# Patient Record
Sex: Male | Born: 1980 | Race: White | Hispanic: No | Marital: Single | Smoking: Current every day smoker
Health system: Southern US, Community
[De-identification: ages and names within clinical notes are randomized; demographics above are authoritative.]

## PROBLEM LIST (undated history)

## (undated) DIAGNOSIS — K920 Hematemesis: Secondary | ICD-10-CM

## (undated) DIAGNOSIS — K7010 Alcoholic hepatitis without ascites: Secondary | ICD-10-CM

## (undated) DIAGNOSIS — E1165 Type 2 diabetes mellitus with hyperglycemia: Secondary | ICD-10-CM

## (undated) DIAGNOSIS — D696 Thrombocytopenia, unspecified: Secondary | ICD-10-CM

## (undated) DIAGNOSIS — F418 Other specified anxiety disorders: Secondary | ICD-10-CM

## (undated) DIAGNOSIS — I10 Essential (primary) hypertension: Secondary | ICD-10-CM

## (undated) DIAGNOSIS — D689 Coagulation defect, unspecified: Secondary | ICD-10-CM

## (undated) DIAGNOSIS — F1010 Alcohol abuse, uncomplicated: Secondary | ICD-10-CM

## (undated) DIAGNOSIS — K219 Gastro-esophageal reflux disease without esophagitis: Secondary | ICD-10-CM

## (undated) HISTORY — PX: WISDOM TOOTH EXTRACTION: SHX21

---

## 2015-09-11 DIAGNOSIS — D689 Coagulation defect, unspecified: Secondary | ICD-10-CM

## 2015-09-11 DIAGNOSIS — F418 Other specified anxiety disorders: Secondary | ICD-10-CM

## 2015-09-11 DIAGNOSIS — D696 Thrombocytopenia, unspecified: Secondary | ICD-10-CM

## 2015-09-11 DIAGNOSIS — K7010 Alcoholic hepatitis without ascites: Secondary | ICD-10-CM

## 2015-09-11 DIAGNOSIS — K920 Hematemesis: Secondary | ICD-10-CM

## 2015-09-11 HISTORY — DX: Alcoholic hepatitis without ascites: K70.10

## 2015-09-11 HISTORY — DX: Coagulation defect, unspecified: D68.9

## 2015-09-11 HISTORY — DX: Other specified anxiety disorders: F41.8

## 2015-09-11 HISTORY — DX: Hematemesis: K92.0

## 2015-09-11 HISTORY — DX: Thrombocytopenia, unspecified: D69.6

## 2015-09-12 ENCOUNTER — Emergency Department (HOSPITAL_COMMUNITY)
Admission: EM | Admit: 2015-09-12 | Discharge: 2015-09-12 | Disposition: A | Discharge status: Home / Self Care | Payer: Self-pay | Attending: Emergency Medicine | Admitting: Emergency Medicine

## 2015-09-12 ENCOUNTER — Encounter (HOSPITAL_COMMUNITY): Payer: Self-pay | Admitting: Emergency Medicine

## 2015-09-12 ENCOUNTER — Emergency Department (HOSPITAL_COMMUNITY): Payer: Self-pay

## 2015-09-12 DIAGNOSIS — F17210 Nicotine dependence, cigarettes, uncomplicated: Secondary | ICD-10-CM | POA: Insufficient documentation

## 2015-09-12 DIAGNOSIS — R079 Chest pain, unspecified: Secondary | ICD-10-CM | POA: Insufficient documentation

## 2015-09-12 DIAGNOSIS — I10 Essential (primary) hypertension: Secondary | ICD-10-CM | POA: Insufficient documentation

## 2015-09-12 DIAGNOSIS — R0789 Other chest pain: Secondary | ICD-10-CM | POA: Insufficient documentation

## 2015-09-12 DIAGNOSIS — R0602 Shortness of breath: Secondary | ICD-10-CM | POA: Insufficient documentation

## 2015-09-12 DIAGNOSIS — R531 Weakness: Secondary | ICD-10-CM | POA: Insufficient documentation

## 2015-09-12 DIAGNOSIS — R42 Dizziness and giddiness: Secondary | ICD-10-CM | POA: Insufficient documentation

## 2015-09-12 DIAGNOSIS — F419 Anxiety disorder, unspecified: Secondary | ICD-10-CM | POA: Insufficient documentation

## 2015-09-12 DIAGNOSIS — R5383 Other fatigue: Secondary | ICD-10-CM | POA: Insufficient documentation

## 2015-09-12 HISTORY — DX: Essential (primary) hypertension: I10

## 2015-09-12 LAB — CBC
HCT: 45.1 % (ref 39.0–52.0)
Hemoglobin: 15.9 g/dL (ref 13.0–17.0)
MCH: 33.6 pg (ref 26.0–34.0)
MCHC: 35.3 g/dL (ref 30.0–36.0)
MCV: 95.3 fL (ref 78.0–100.0)
Platelets: 135 10*3/uL — ABNORMAL LOW (ref 150–400)
RBC: 4.73 MIL/uL (ref 4.22–5.81)
RDW: 13.8 % (ref 11.5–15.5)
WBC: 16.1 10*3/uL — ABNORMAL HIGH (ref 4.0–10.5)

## 2015-09-12 LAB — BASIC METABOLIC PANEL
Anion gap: 17 — ABNORMAL HIGH (ref 5–15)
CALCIUM: 8.7 mg/dL — AB (ref 8.9–10.3)
CHLORIDE: 93 mmol/L — AB (ref 101–111)
CO2: 25 mmol/L (ref 22–32)
CREATININE: 0.67 mg/dL (ref 0.61–1.24)
GFR calc non Af Amer: >60 mL/min (ref >60)
Glucose, Bld: 303 mg/dL — ABNORMAL HIGH (ref 65–99)
Potassium: 3.4 mmol/L — ABNORMAL LOW (ref 3.5–5.1)
Sodium: 135 mmol/L (ref 135–145)

## 2015-09-12 LAB — I-STAT TROPONIN, ED: Troponin i, poc: 0.01 ng/mL (ref 0.00–0.08)

## 2015-09-12 NOTE — ED Notes (Signed)
No answer from waiting room.

## 2015-09-12 NOTE — ED Notes (Signed)
Pt c/o SOB, chest tightness, weakness, fatigue, anxiety, dizziness x 8 days. Pt reports strep throat at beginning of January, took penicillin course, never really felt better.

## 2015-09-20 ENCOUNTER — Inpatient Hospital Stay (HOSPITAL_COMMUNITY)
Admission: EM | Admit: 2015-09-20 | Discharge: 2015-09-28 | DRG: 433 | Disposition: A | Discharge status: Home / Self Care | Payer: Medicaid - Out of State | Attending: Internal Medicine | Admitting: Internal Medicine

## 2015-09-20 ENCOUNTER — Encounter (HOSPITAL_COMMUNITY): Payer: Self-pay | Admitting: Emergency Medicine

## 2015-09-20 ENCOUNTER — Emergency Department (HOSPITAL_COMMUNITY): Payer: Medicaid - Out of State

## 2015-09-20 DIAGNOSIS — R739 Hyperglycemia, unspecified: Secondary | ICD-10-CM | POA: Diagnosis not present

## 2015-09-20 DIAGNOSIS — K700 Alcoholic fatty liver: Secondary | ICD-10-CM | POA: Diagnosis present

## 2015-09-20 DIAGNOSIS — K759 Inflammatory liver disease, unspecified: Secondary | ICD-10-CM | POA: Diagnosis present

## 2015-09-20 DIAGNOSIS — F10231 Alcohol dependence with withdrawal delirium: Secondary | ICD-10-CM

## 2015-09-20 DIAGNOSIS — K219 Gastro-esophageal reflux disease without esophagitis: Secondary | ICD-10-CM | POA: Diagnosis present

## 2015-09-20 DIAGNOSIS — D72829 Elevated white blood cell count, unspecified: Secondary | ICD-10-CM | POA: Diagnosis not present

## 2015-09-20 DIAGNOSIS — E871 Hypo-osmolality and hyponatremia: Secondary | ICD-10-CM | POA: Diagnosis present

## 2015-09-20 DIAGNOSIS — R1084 Generalized abdominal pain: Secondary | ICD-10-CM | POA: Diagnosis not present

## 2015-09-20 DIAGNOSIS — F10230 Alcohol dependence with withdrawal, uncomplicated: Secondary | ICD-10-CM

## 2015-09-20 DIAGNOSIS — K920 Hematemesis: Secondary | ICD-10-CM | POA: Diagnosis present

## 2015-09-20 DIAGNOSIS — Z811 Family history of alcohol abuse and dependence: Secondary | ICD-10-CM

## 2015-09-20 DIAGNOSIS — R109 Unspecified abdominal pain: Secondary | ICD-10-CM | POA: Diagnosis not present

## 2015-09-20 DIAGNOSIS — R0781 Pleurodynia: Secondary | ICD-10-CM | POA: Insufficient documentation

## 2015-09-20 DIAGNOSIS — F10239 Alcohol dependence with withdrawal, unspecified: Secondary | ICD-10-CM | POA: Diagnosis present

## 2015-09-20 DIAGNOSIS — K7290 Hepatic failure, unspecified without coma: Secondary | ICD-10-CM | POA: Diagnosis present

## 2015-09-20 DIAGNOSIS — F10930 Alcohol use, unspecified with withdrawal, uncomplicated: Secondary | ICD-10-CM

## 2015-09-20 DIAGNOSIS — K0510 Chronic gingivitis, plaque induced: Secondary | ICD-10-CM | POA: Diagnosis present

## 2015-09-20 DIAGNOSIS — E876 Hypokalemia: Secondary | ICD-10-CM | POA: Diagnosis present

## 2015-09-20 DIAGNOSIS — R002 Palpitations: Secondary | ICD-10-CM | POA: Diagnosis present

## 2015-09-20 DIAGNOSIS — Z8349 Family history of other endocrine, nutritional and metabolic diseases: Secondary | ICD-10-CM | POA: Diagnosis not present

## 2015-09-20 DIAGNOSIS — R531 Weakness: Secondary | ICD-10-CM | POA: Diagnosis present

## 2015-09-20 DIAGNOSIS — IMO0002 Reserved for concepts with insufficient information to code with codable children: Secondary | ICD-10-CM | POA: Diagnosis present

## 2015-09-20 DIAGNOSIS — R140 Abdominal distension (gaseous): Secondary | ICD-10-CM | POA: Diagnosis present

## 2015-09-20 DIAGNOSIS — R188 Other ascites: Secondary | ICD-10-CM

## 2015-09-20 DIAGNOSIS — R1310 Dysphagia, unspecified: Secondary | ICD-10-CM | POA: Diagnosis present

## 2015-09-20 DIAGNOSIS — R059 Cough, unspecified: Secondary | ICD-10-CM

## 2015-09-20 DIAGNOSIS — K7011 Alcoholic hepatitis with ascites: Principal | ICD-10-CM | POA: Diagnosis present

## 2015-09-20 DIAGNOSIS — I10 Essential (primary) hypertension: Secondary | ICD-10-CM | POA: Diagnosis present

## 2015-09-20 DIAGNOSIS — E1165 Type 2 diabetes mellitus with hyperglycemia: Secondary | ICD-10-CM | POA: Diagnosis present

## 2015-09-20 DIAGNOSIS — K3189 Other diseases of stomach and duodenum: Secondary | ICD-10-CM | POA: Diagnosis present

## 2015-09-20 DIAGNOSIS — Y908 Blood alcohol level of 240 mg/100 ml or more: Secondary | ICD-10-CM | POA: Diagnosis present

## 2015-09-20 DIAGNOSIS — K766 Portal hypertension: Secondary | ICD-10-CM | POA: Diagnosis present

## 2015-09-20 DIAGNOSIS — K7010 Alcoholic hepatitis without ascites: Secondary | ICD-10-CM | POA: Diagnosis present

## 2015-09-20 DIAGNOSIS — D6959 Other secondary thrombocytopenia: Secondary | ICD-10-CM | POA: Diagnosis present

## 2015-09-20 DIAGNOSIS — R05 Cough: Secondary | ICD-10-CM

## 2015-09-20 DIAGNOSIS — F17210 Nicotine dependence, cigarettes, uncomplicated: Secondary | ICD-10-CM | POA: Diagnosis present

## 2015-09-20 DIAGNOSIS — R17 Unspecified jaundice: Secondary | ICD-10-CM

## 2015-09-20 DIAGNOSIS — F410 Panic disorder [episodic paroxysmal anxiety] without agoraphobia: Secondary | ICD-10-CM | POA: Diagnosis present

## 2015-09-20 DIAGNOSIS — F10939 Alcohol use, unspecified with withdrawal, unspecified: Secondary | ICD-10-CM | POA: Diagnosis present

## 2015-09-20 DIAGNOSIS — Z8249 Family history of ischemic heart disease and other diseases of the circulatory system: Secondary | ICD-10-CM

## 2015-09-20 DIAGNOSIS — R791 Abnormal coagulation profile: Secondary | ICD-10-CM

## 2015-09-20 DIAGNOSIS — F1010 Alcohol abuse, uncomplicated: Secondary | ICD-10-CM

## 2015-09-20 HISTORY — DX: Thrombocytopenia, unspecified: D69.6

## 2015-09-20 HISTORY — DX: Type 2 diabetes mellitus with hyperglycemia: E11.65

## 2015-09-20 HISTORY — DX: Gastro-esophageal reflux disease without esophagitis: K21.9

## 2015-09-20 HISTORY — DX: Hematemesis: K92.0

## 2015-09-20 HISTORY — DX: Other specified anxiety disorders: F41.8

## 2015-09-20 HISTORY — DX: Alcohol abuse, uncomplicated: F10.10

## 2015-09-20 HISTORY — DX: Alcoholic hepatitis without ascites: K70.10

## 2015-09-20 HISTORY — DX: Coagulation defect, unspecified: D68.9

## 2015-09-20 LAB — URINALYSIS, ROUTINE W REFLEX MICROSCOPIC
Glucose, UA: >1000 mg/dL — AB
Ketones, ur: NEGATIVE mg/dL
Leukocytes, UA: NEGATIVE
Nitrite: NEGATIVE
Protein, ur: 30 mg/dL — AB
Specific Gravity, Urine: 1.022 (ref 1.005–1.030)
pH: 6.5 (ref 5.0–8.0)

## 2015-09-20 LAB — TSH: TSH: 1.211 u[IU]/mL (ref 0.350–4.500)

## 2015-09-20 LAB — MAGNESIUM: Magnesium: 1.8 mg/dL (ref 1.7–2.4)

## 2015-09-20 LAB — CBC WITH DIFFERENTIAL/PLATELET
Basophils Absolute: 0 10*3/uL (ref 0.0–0.1)
Basophils Relative: 0 %
Eosinophils Absolute: 0 10*3/uL (ref 0.0–0.7)
Eosinophils Relative: 0 %
HCT: 40.6 % (ref 39.0–52.0)
Hemoglobin: 14.6 g/dL (ref 13.0–17.0)
Lymphocytes Relative: 6 %
Lymphs Abs: 0.9 10*3/uL (ref 0.7–4.0)
MCH: 32.7 pg (ref 26.0–34.0)
MCHC: 36 g/dL (ref 30.0–36.0)
MCV: 91 fL (ref 78.0–100.0)
Monocytes Absolute: 1.5 10*3/uL — ABNORMAL HIGH (ref 0.1–1.0)
Monocytes Relative: 10 %
Neutro Abs: 12.6 10*3/uL — ABNORMAL HIGH (ref 1.7–7.7)
Neutrophils Relative %: 84 %
Platelets: 121 10*3/uL — ABNORMAL LOW (ref 150–400)
RBC: 4.46 MIL/uL (ref 4.22–5.81)
RDW: 14.7 % (ref 11.5–15.5)
WBC: 15 10*3/uL — ABNORMAL HIGH (ref 4.0–10.5)

## 2015-09-20 LAB — RAPID URINE DRUG SCREEN, HOSP PERFORMED
AMPHETAMINES: NOT DETECTED
BARBITURATES: NOT DETECTED
Benzodiazepines: NOT DETECTED
Cocaine: NOT DETECTED
Opiates: POSITIVE — AB
TETRAHYDROCANNABINOL: NOT DETECTED

## 2015-09-20 LAB — URINE MICROSCOPIC-ADD ON

## 2015-09-20 LAB — GLUCOSE, CAPILLARY: Glucose-Capillary: 151 mg/dL — ABNORMAL HIGH (ref 65–99)

## 2015-09-20 LAB — COMPREHENSIVE METABOLIC PANEL
ALT: 62 U/L (ref 17–63)
AST: 285 U/L — ABNORMAL HIGH (ref 15–41)
Albumin: 3.1 g/dL — ABNORMAL LOW (ref 3.5–5.0)
Alkaline Phosphatase: 229 U/L — ABNORMAL HIGH (ref 38–126)
Anion gap: 17 — ABNORMAL HIGH (ref 5–15)
BUN: <5 mg/dL — ABNORMAL LOW (ref 6–20)
CO2: 28 mmol/L (ref 22–32)
Calcium: 8 mg/dL — ABNORMAL LOW (ref 8.9–10.3)
Chloride: 87 mmol/L — ABNORMAL LOW (ref 101–111)
Creatinine, Ser: 0.39 mg/dL — ABNORMAL LOW (ref 0.61–1.24)
GFR calc Af Amer: >60 mL/min (ref >60)
GFR calc non Af Amer: >60 mL/min (ref >60)
Glucose, Bld: 230 mg/dL — ABNORMAL HIGH (ref 65–99)
Potassium: 2.7 mmol/L — CL (ref 3.5–5.1)
Sodium: 132 mmol/L — ABNORMAL LOW (ref 135–145)
Total Bilirubin: 11.4 mg/dL — ABNORMAL HIGH (ref 0.3–1.2)
Total Protein: 7.4 g/dL (ref 6.5–8.1)

## 2015-09-20 LAB — ETHANOL: Alcohol, Ethyl (B): 354 mg/dL (ref <5)

## 2015-09-20 LAB — AMMONIA: AMMONIA: 79 umol/L — AB (ref 9–35)

## 2015-09-20 LAB — PROTIME-INR
INR: 1.71 — ABNORMAL HIGH (ref 0.00–1.49)
Prothrombin Time: 19.4 seconds — ABNORMAL HIGH (ref 11.6–15.2)

## 2015-09-20 LAB — LIPASE, BLOOD: Lipase: 166 U/L — ABNORMAL HIGH (ref 11–51)

## 2015-09-20 LAB — MRSA PCR SCREENING: MRSA BY PCR: NEGATIVE

## 2015-09-20 MED ORDER — SODIUM CHLORIDE 0.9% FLUSH
3.0000 mL | Freq: Two times a day (BID) | INTRAVENOUS | Status: DC
  Administered 2015-09-20 – 2015-09-27 (×12): 3 mL via INTRAVENOUS

## 2015-09-20 MED ORDER — PROPRANOLOL HCL 20 MG PO TABS
20.0000 mg | ORAL_TABLET | Freq: Three times a day (TID) | ORAL | Status: DC
  Administered 2015-09-20 – 2015-09-23 (×9): 20 mg via ORAL
  Filled 2015-09-20 (×12): qty 1

## 2015-09-20 MED ORDER — THIAMINE HCL 100 MG/ML IJ SOLN
100.0000 mg | Freq: Once | INTRAMUSCULAR | Status: AC
  Administered 2015-09-20: 100 mg via INTRAVENOUS
  Filled 2015-09-20: qty 2

## 2015-09-20 MED ORDER — DIAZEPAM 5 MG/ML IJ SOLN
10.0000 mg | Freq: Once | INTRAMUSCULAR | Status: AC
  Administered 2015-09-20: 10 mg via INTRAMUSCULAR
  Filled 2015-09-20: qty 2

## 2015-09-20 MED ORDER — DIAZEPAM 5 MG/ML IJ SOLN
5.0000 mg | Freq: Once | INTRAMUSCULAR | Status: AC
  Administered 2015-09-20: 5 mg via INTRAVENOUS
  Filled 2015-09-20: qty 2

## 2015-09-20 MED ORDER — LORAZEPAM 2 MG/ML IJ SOLN
2.0000 mg | INTRAMUSCULAR | Status: DC | PRN
  Administered 2015-09-20 – 2015-09-21 (×4): 2 mg via INTRAVENOUS
  Filled 2015-09-20 (×4): qty 1

## 2015-09-20 MED ORDER — PANTOPRAZOLE SODIUM 40 MG IV SOLR: 40.0000 mg | Freq: Two times a day (BID) | INTRAVENOUS | Status: DC

## 2015-09-20 MED ORDER — DIAZEPAM 5 MG/ML IJ SOLN: 5.0000 mg | Freq: Once | INTRAMUSCULAR | Status: DC

## 2015-09-20 MED ORDER — ADULT MULTIVITAMIN W/MINERALS CH
1.0000 | ORAL_TABLET | Freq: Once | ORAL | Status: AC
  Administered 2015-09-20: 1 via ORAL
  Filled 2015-09-20: qty 1

## 2015-09-20 MED ORDER — INSULIN ASPART 100 UNIT/ML ~~LOC~~ SOLN
0.0000 [IU] | Freq: Three times a day (TID) | SUBCUTANEOUS | Status: DC
  Administered 2015-09-21 – 2015-09-24 (×5): 2 [IU] via SUBCUTANEOUS
  Administered 2015-09-24: 1 [IU] via SUBCUTANEOUS
  Administered 2015-09-25: 3 [IU] via SUBCUTANEOUS
  Administered 2015-09-25: 2 [IU] via SUBCUTANEOUS
  Administered 2015-09-26 (×2): 1 [IU] via SUBCUTANEOUS
  Administered 2015-09-26: 3 [IU] via SUBCUTANEOUS
  Administered 2015-09-27: 5 [IU] via SUBCUTANEOUS
  Administered 2015-09-27 – 2015-09-28 (×2): 1 [IU] via SUBCUTANEOUS

## 2015-09-20 MED ORDER — FOLIC ACID 1 MG PO TABS
1.0000 mg | ORAL_TABLET | Freq: Once | ORAL | Status: AC
  Administered 2015-09-20: 1 mg via ORAL
  Filled 2015-09-20: qty 1

## 2015-09-20 MED ORDER — MAGNESIUM CHLORIDE 64 MG PO TBEC
1.0000 | DELAYED_RELEASE_TABLET | Freq: Two times a day (BID) | ORAL | Status: DC
  Administered 2015-09-20 – 2015-09-28 (×16): 64 mg via ORAL
  Filled 2015-09-20 (×16): qty 1

## 2015-09-20 MED ORDER — SODIUM CHLORIDE 0.9 % IV SOLN
INTRAVENOUS | Status: DC
  Administered 2015-09-20: 17:00:00 via INTRAVENOUS

## 2015-09-20 MED ORDER — SODIUM CHLORIDE 0.9 % IV SOLN
8.0000 mg/h | INTRAVENOUS | Status: AC
  Administered 2015-09-20 – 2015-09-23 (×8): 8 mg/h via INTRAVENOUS
  Filled 2015-09-20 (×14): qty 80

## 2015-09-20 MED ORDER — SODIUM CHLORIDE 0.9 % IV SOLN
80.0000 mg | Freq: Once | INTRAVENOUS | Status: AC
  Administered 2015-09-20: 80 mg via INTRAVENOUS
  Filled 2015-09-20: qty 80

## 2015-09-20 MED ORDER — KCL IN DEXTROSE-NACL 40-5-0.9 MEQ/L-%-% IV SOLN
INTRAVENOUS | Status: DC
  Administered 2015-09-20: 20:00:00 via INTRAVENOUS
  Filled 2015-09-20 (×3): qty 1000

## 2015-09-20 MED ORDER — POTASSIUM CHLORIDE CRYS ER 20 MEQ PO TBCR
60.0000 meq | EXTENDED_RELEASE_TABLET | Freq: Once | ORAL | Status: AC
  Administered 2015-09-20: 60 meq via ORAL
  Filled 2015-09-20: qty 3

## 2015-09-20 MED ORDER — VITAMIN B-1 100 MG PO TABS
100.0000 mg | ORAL_TABLET | Freq: Every day | ORAL | Status: DC
  Administered 2015-09-21 – 2015-09-28 (×8): 100 mg via ORAL
  Filled 2015-09-20 (×8): qty 1

## 2015-09-20 MED ORDER — FOLIC ACID 1 MG PO TABS
1.0000 mg | ORAL_TABLET | Freq: Every day | ORAL | Status: DC
  Administered 2015-09-21 – 2015-09-28 (×8): 1 mg via ORAL
  Filled 2015-09-20 (×8): qty 1

## 2015-09-20 MED ORDER — POTASSIUM CHLORIDE IN NACL 40-0.9 MEQ/L-% IV SOLN
INTRAVENOUS | Status: DC
  Filled 2015-09-20: qty 1000

## 2015-09-20 MED ORDER — INSULIN ASPART 100 UNIT/ML ~~LOC~~ SOLN: 0.0000 [IU] | Freq: Every day | SUBCUTANEOUS | Status: DC

## 2015-09-20 MED ORDER — CLOTRIMAZOLE 10 MG MT TROC
10.0000 mg | Freq: Every day | OROMUCOSAL | Status: DC
  Administered 2015-09-20 – 2015-09-28 (×38): 10 mg via ORAL
  Filled 2015-09-20 (×45): qty 1

## 2015-09-20 MED ORDER — HYDROMORPHONE HCL 1 MG/ML IJ SOLN
1.0000 mg | Freq: Once | INTRAMUSCULAR | Status: AC
  Administered 2015-09-20: 1 mg via INTRAVENOUS
  Filled 2015-09-20: qty 1

## 2015-09-20 MED ORDER — CHLORHEXIDINE GLUCONATE 0.12 % MT SOLN
15.0000 mL | Freq: Two times a day (BID) | OROMUCOSAL | Status: DC
Start: 2015-09-20 — End: 2015-09-28
  Administered 2015-09-20 – 2015-09-28 (×15): 15 mL via OROMUCOSAL
  Filled 2015-09-20 (×16): qty 15

## 2015-09-20 MED ORDER — ADULT MULTIVITAMIN W/MINERALS CH
1.0000 | ORAL_TABLET | Freq: Every day | ORAL | Status: DC
  Administered 2015-09-21 – 2015-09-28 (×8): 1 via ORAL
  Filled 2015-09-20 (×8): qty 1

## 2015-09-20 NOTE — ED Notes (Signed)
MD at bedside. 

## 2015-09-20 NOTE — Progress Notes (Signed)
Patient brother called with questions regarding patient discharge. Brother, Chad Orozco, wants patient to be admitted to a rehab facility following discharge, regarding patient's alcohol abuse. Patient has not expressed any plans for such facility following discharge. Social work to consult if patient does agree to rehab facility following discharge. Patient brother lives in [place], [place], but states he does plan to visit his brother on 3/11 in the afternoon. Chad Orozco can be contacted at [phone].

## 2015-09-20 NOTE — ED Notes (Signed)
Upon entering pt's room IV catheter found pulled out of pt's hand bleeding and hematoma noted. Gauze and tape applied to IV site. Pt attempting urine sample at current time.

## 2015-09-20 NOTE — ED Provider Notes (Signed)
CSN: [id number]     Arrival date & time 09/20/15  0759 History   First MD Initiated Contact with Patient 09/20/15 [id number]     Chief Complaint  Patient presents with  . Fatigue     (Consider location/radiation/quality/duration/timing/severity/associated sxs/prior Treatment) HPI  35 year old male presenting with friends for evaluation of alcohol abuse. Patient has a long-standing history of alcohol abuse for years. This been increasing over the last several weeks. No specific precipitant for this increase. He has been having increased generalized weakness. He is also felt short of breath at times. Has noticed that his urine has been darker in color and eyes have been yellow. Nauseated. Has vomited a few times over last few days. Denies blood on vomit or stool. No melena. He reports poor appetite. Over the last two days, aside from the alcohol, he has  only had a few handful of grapes. He was told on prior urgent care visit that he had high blood pressure but does not have a formal diagnosis of this is and is not on medications. Denies any other substance abuse. He is not suicidal or homicidal. His hope is to go to [facility] for rehab but he was referred to ED for medical clearance first.   Past Medical History  Diagnosis Date  . Hypertension    History reviewed. No pertinent past surgical history. History reviewed. No pertinent family history. Social History  Substance Use Topics  . Smoking status: Current Every Day Smoker -- 0.30 packs/day    Types: Cigarettes  . Smokeless tobacco: None  . Alcohol Use: 12.0 oz/week    20 Cans of beer per week    Review of Systems  All systems reviewed and negative, other than as noted in HPI.   Allergies  Review of patient's allergies indicates no known allergies.  Home Medications   Prior to Admission medications   Medication Sig Start Date End Date Taking? Authorizing Provider  Multiple Vitamins-Minerals (CENTRUM MEN) TABS Take 1 tablet  by mouth daily.   Yes Historical Provider, MD   BP 148/94 mmHg  Pulse 96  Temp(Src) 98.1 F (36.7 C) (Oral)  Resp 18  SpO2 96% Physical Exam  Constitutional: He is oriented to person, place, and time. He appears well-developed and well-nourished. No distress.  Laying in bed. Appears tired and somewhat disheveled, but not distressed.   HENT:  Head: Normocephalic and atraumatic.  Eyes: Conjunctivae are normal. Pupils are equal, round, and reactive to light. Right eye exhibits no discharge. Left eye exhibits no discharge. Scleral icterus is present.  Neck: Neck supple.  Cardiovascular: Regular rhythm and normal heart sounds.  Exam reveals no gallop and no friction rub.   No murmur heard. tachycardia  Pulmonary/Chest: Effort normal and breath sounds normal. No respiratory distress.  Abdominal: Soft. He exhibits no distension. There is no tenderness.  Abdominal distension. Somewhat tense, but non tender. Hepatomegaly.  Musculoskeletal: He exhibits no edema or tenderness.  Neurological: He is alert and oriented to person, place, and time. No cranial nerve deficit. He exhibits normal muscle tone. Coordination normal.  Skin: Skin is warm and dry.  Psychiatric: He has a normal mood and affect. His behavior is normal. Thought content normal.  Nursing note and vitals reviewed.   ED Course  Procedures (including critical care time) Labs Review Labs Reviewed  URINALYSIS, ROUTINE W REFLEX MICROSCOPIC (NOT AT [facility]) - Abnormal; Notable for the following:    Color, Urine AMBER (*)    Glucose, UA >1000 (*)  Hgb urine dipstick SMALL (*)    Bilirubin Urine LARGE (*)    Protein, ur 30 (*)    All other components within normal limits  PROTIME-INR - Abnormal; Notable for the following:    Prothrombin Time 19.4 (*)    INR 1.71 (*)    All other components within normal limits  CBC WITH DIFFERENTIAL/PLATELET - Abnormal; Notable for the following:    WBC 15.0 (*)    Platelets 121 (*)    Neutro  Abs 12.6 (*)    Monocytes Absolute 1.5 (*)    All other components within normal limits  COMPREHENSIVE METABOLIC PANEL - Abnormal; Notable for the following:    Sodium 132 (*)    Potassium 2.7 (*)    Chloride 87 (*)    Glucose, Bld 230 (*)    BUN <5 (*)    Creatinine, Ser 0.39 (*)    Calcium 8.0 (*)    Albumin 3.1 (*)    AST 285 (*)    Alkaline Phosphatase 229 (*)    Total Bilirubin 11.4 (*)    Anion gap 17 (*)    All other components within normal limits  LIPASE, BLOOD - Abnormal; Notable for the following:    Lipase 166 (*)    All other components within normal limits  ETHANOL - Abnormal; Notable for the following:    Alcohol, Ethyl (B) 354 (*)    All other components within normal limits  URINE MICROSCOPIC-ADD ON - Abnormal; Notable for the following:    Squamous Epithelial / LPF 0-5 (*)    Bacteria, UA RARE (*)    All other components within normal limits  MAGNESIUM  HEMOGLOBIN A1C  HEPATITIS PANEL, ACUTE    Imaging Review US Abdomen Limited  09/20/2015  CLINICAL DATA:  Abnormal LFTs, ETOH abuse EXAM: US ABDOMEN LIMITED - RIGHT UPPER QUADRANT COMPARISON:  None. FINDINGS: Gallbladder: No shadowing gallstones are noted within gallbladder. Layering gallbladder sludge. Mild thickening of gallbladder wall up to 3.7 mm. No sonographic Murphy's sign. Common bile duct: Diameter: 4 mm in diameter within normal limits. Liver: There is coarse increased echogenicity of the liver suspicious for fatty infiltration or chronic hepatitis. Small perihepatic ascites. No focal hepatic mass. No color flow is identified within portal vein. IMPRESSION: 1. No shadowing gallstones are noted within gallbladder. Layering gallbladder sludge is noted. Mild thickening of gallbladder wall up to 3.7 mm without sonographic Murphy's sign. Normal CBD. 2. There is coarse increased echogenicity of the liver suspicious for fatty infiltration or chronic hepatitis. Small perihepatic ascites. No focal hepatic mass. No  color flow is identified within portal vein. Electronically Signed   By: Natasha Mead M.D.   On: 09/20/2015 13:43   I have personally reviewed and evaluated these images and lab results as part of my medical decision-making.   EKG Interpretation None      MDM   Final diagnoses:  Alcoholic hepatitis with ascites  Hyperglycemia  Hypokalemia  Alcohol withdrawal, uncomplicated (HCC)    35 year old male brought in by friends for evaluation of alcohol abuse. Is also complaining of increasing fatigue, dyspnea and feeling anxious. Likely alcoholic hepatitis. He is icteric. Has abdominal distention and hepatomegaly. He denies IVDU, promiscuous sex, etc.  Mildly tachycardic and mild hypertension. More than likely secondary to alcohol withdrawal. He appears tired on exam. No appreciable tremor. Will give some Valium and to continue to observe.  LFTs unsurprisingly abnormal. T bili is greater than 11. Some synthetic dysfunction of his liver with  an INR of 1.7. Mild hypoalbuminemia. Mild elevation of lipase. This is all probably from alcoholic hepatitis. He'll be dead well before 40th birthday if continues like this.  GB sludge and mild wall thickening. Describes abdominal discomfort and frequent hiccups than he does actual pain. No focal tenderness on abdominal exam. Does have leukocytosis, but this is not specific. I do not think this is acute cholecystitis. The therapy at this point is primarily cessation of alcohol.  Increasing withdrawal symptoms while in ED. Doesn't have PCP care established let alone gastroenterology. Friends have been actively contacting rehab facilities to get him placed but as of yet, no definitive plan in place. I think best plan at this time would to be admit for withdrawal, electrolyte management, hyperglycemia, etc.    Raeford RazorStephen Audy Dauphine, MD 09/20/15 [id number]

## 2015-09-20 NOTE — Consult Note (Signed)
Referring Provider: [facility] Primary Care Physician:  No primary care provider on file. Primary Gastroenterologist: unassigned  Reason for Consultation:  Abnormal LFTs     HPI: Chad Orozco is a 35 y.o. male originally from [place], [place] who relocated to [place] years ago. He presented to the ER today with complaints of generalized weakness asking for help with his alcohol abuse. Patient reports that since college he drinks 04-1211 ounce beers daily. Over the past week to week and a half he has also been drinking a fifth of whiskey a day along with 12 beers. Approximately 1-1/2-2 weeks ago he began to feel weak with generalized body aches and thought he had the flu. Over the past several days has noted that his urine is very dark and the size of turned yellow. He has been nauseous constantly. He states he has a history of reflux and has used Prevacid OTC intermittently for the past several years with varying degrees of relief over the past several days he has vomited several times and states for the past 2 days he has been vomiting blood. He denies bright red blood per rectum or melena. He states his appetite has been very poor. He has had a dull abdominal pain and notes that his abdomen has become distended. He has no PCP but was evaluated in an urgent care facility several weeks ago and was told that he had high blood pressure was advised to find a primary care provider. He denies intravenous drug abuse but admits to intermittent intranasal cocaine. He has no tattoos and has never had a blood transfusion. He denies use of herbal supplements and denies excessive use of nonsteroidal anti-inflammatories. He is unaware of her family history of colon cancer, colon polyps, or inflammatory bowel disease he is unaware of family history of esophageal or stomach cancer but states his brother had a perforated esophagus requiring urgent surgery. He does not know the details. She states that over the past several  weeks he has had excess absences from work because he hasn't felt well. He thinks this is due to his alcoholism. He was fired from his job at the [employer] this morning. He states he feels he is depressed but denies being suicidal or homicidal. He states he feels excessively anxious over the past several days and feels he needs help. Unfortunately patient has no family nearby has some of his family live in [place] in the [employer]. He does have a roommate.   Past Medical History  Diagnosis Date  . Hypertension     History reviewed. No pertinent past surgical history.  Prior to Admission medications   Medication Sig Start Date End Date Taking? Authorizing Provider  Multiple Vitamins-Minerals (CENTRUM MEN) TABS Take 1 tablet by mouth daily.   Yes Historical Provider, MD    No current facility-administered medications for this encounter.   Current Outpatient Prescriptions  Medication Sig Dispense Refill  . Multiple Vitamins-Minerals (CENTRUM MEN) TABS Take 1 tablet by mouth daily.      Allergies as of 09/20/2015  . (No Known Allergies)    History reviewed. No pertinent family history.  Social History   Social History  . Marital Status: Single    Spouse Name: N/A  . Number of Children: N/A  . Years of Education: N/A   Occupational History  . Not on file.   Social History Main Topics  . Smoking status: Current Every Day Smoker -- 0.30 packs/day    Types: Cigarettes  .  Smokeless tobacco: Not on file  . Alcohol Use: 12.0 oz/week    20 Cans of beer per week  . Drug Use: No  . Sexual Activity: Not on file   Other Topics Concern  . Not on file   Social History Narrative    Review of Systems: Gen: Denies any fever, chills, sweats. Admits to anorexia, weakness, and fatigue. CV: Denies chest pain, angina, palpitations, syncope, orthopnea, PND, peripheral edema, and claudication. Resp: Denies dyspnea at rest, dyspnea with exercise, cough, sputum, wheezing,  coughing up blood, and pleurisy. GI: Admits to heartburn of several years duration. Admits to progressive dysphagia to solids. Admits to hematemesis 2 days. GU : Admits to dark urine times several days. MS: Denies joint pain, limitation of movement, and swelling, stiffness, low back pain, extremity pain. Denies muscle weakness, cramps, atrophy.  Derm: Denies rash, itching, dry skin, hives, moles, warts, or unhealing ulcers.  Psych: Admits to anxiety and depression. Heme: Denies bruising, bleeding, and enlarged lymph nodes. Endo:  Denies any problems with DM, thyroid, adrenal function.  Physical Exam: Vital signs in last 24 hours: Temp:  [97.7 F (36.5 C)-98.3 F (36.8 C)] 97.8 F (36.6 C) (03/10 1526) Pulse Rate:  [96-124] 118 (03/10 1541) Resp:  [16-18] 16 (03/10 1526) BP: (139-160)/(68-94) 151/89 mmHg (03/10 1541) SpO2:  [94 %-99 %] 94 % (03/10 1526)   General:   Alert,  Well-developed, disheveled but pleasant in no apparent distress. Head:  Normocephalic and atraumatic. Eyes:  Sclera icteric,   Conjunctiva pink. Ears:  Normal auditory acuity. Nose:  No deformity, discharge,  or lesions. Mouth:  No deformity or lesions.   Neck:  Supple; no masses or thyromegaly. Lungs:  Clear throughout to auscultation.   No wheezes, crackles, or rhonchi.  Heart:  Regular rate and rhythm; no murmurs, clicks, rubs,  or gallops. Abdomen:  Distended, tense,,nontender, BS active,nonpalp mass or hsm.   Rectal:  Deferred  Msk:  Symmetrical without gross deformities. . Pulses:  Normal pulses noted. Extremities:  Without clubbing or edema. Dried blood on both feet-patient does not know how he got there. Neurologic:  Alert and  oriented x4;  grossly normal neurologically. Mild tremor, no asterixis Skin:  Intact without significant lesions or rashes.. Psych: Alert and cooperative. Normal mood and affect.  Intake/Output from previous day:   Intake/Output this shift:    Lab Results:  Recent Labs   09/20/15 1001  WBC 15.0*  HGB 14.6  HCT 40.6  PLT 121*   BMET  Recent Labs  09/20/15 1056  NA 132*  K 2.7*  CL 87*  CO2 28  GLUCOSE 230*  BUN <5*  CREATININE 0.39*  CALCIUM 8.0*   LFT  Recent Labs  09/20/15 1056  PROT 7.4  ALBUMIN 3.1*  AST 285*  ALT 62  ALKPHOS 229*  BILITOT 11.4*  Lipase 166 PT/INR  Recent Labs  09/20/15 1001  LABPROT 19.4*  INR 1.71*  Ethanol 354  Studies/Results: US Abdomen Limited  09/20/2015  CLINICAL DATA:  Abnormal LFTs, ETOH abuse EXAM: US ABDOMEN LIMITED - RIGHT UPPER QUADRANT COMPARISON:  None. FINDINGS: Gallbladder: No shadowing gallstones are noted within gallbladder. Layering gallbladder sludge. Mild thickening of gallbladder wall up to 3.7 mm. No sonographic Murphy's sign. Common bile duct: Diameter: 4 mm in diameter within normal limits. Liver: There is coarse increased echogenicity of the liver suspicious for fatty infiltration or chronic hepatitis. Small perihepatic ascites. No focal hepatic mass. No color flow is identified within portal vein. IMPRESSION: 1.  No shadowing gallstones are noted within gallbladder. Layering gallbladder sludge is noted. Mild thickening of gallbladder wall up to 3.7 mm without sonographic Murphy's sign. Normal CBD. 2. There is coarse increased echogenicity of the liver suspicious for fatty infiltration or chronic hepatitis. Small perihepatic ascites. No focal hepatic mass. No color flow is identified within portal vein. Electronically Signed   By: Natasha MeadLiviu  Pop M.D.   On: 09/20/2015 13:43    IMPRESSION/PLAN: 35 year old male with a history of alcohol abuse brought in by friends with complaints of anxiety, fatigue, scleral icterus, and dark urine. T bili 11.4, AST 285 and ALT 62 with alkaline phosphatase 229. INR elevated at 1.71. Ultrasound with gallbladder sludge and mild wall thickening as well as coarse increased echogenicity of the liver suspicious for fatty infiltration or chronic hepatitis. No color  flow identified within the portal vein. Patient likely has an alcoholic hepatitis. Recommend admission, clear liquid diet, IV fluids, Protonix drip. Trend LFTs and INR. Discriminate function approximately 31, not a candidate for steroids at this time.Recommend CIWA protocol, social services consult for assistance with alcohol rehabilitation. With regards to dysphagia, this may be due to poorly controlled reflux. Will likely need EGD in a few days to evaluate complaints of hematemesis. Trend hemoglobin closely.(EGD would need to be with monitored anesthesia). Would also check hepatitis panel and ammonia.  Hypokalemia Hyperglycemia Hypertension   Hvozdovic, Tollie PizzaLori P PA-C 09/20/2015,  Pager [phone]  Mon-Fri 8a-5p [phone] after 5p, weekends, holidays    [facility] Attending   I have taken an interval history, reviewed the chart and examined the patient. I agree with the Advanced Practitioner's note, impression and recommendations.   Alcoholic hepatitis Alcohol intoxication Hematemesis/reflux Anorexia ? Dysphagia Slight ascites   I ordered hepatitis panel  Needs Detox before we attempt an EGD - I suspect some of his sxs of dysphagia and anorexia could be anxiety, etc.  Will f/u tomorrow and hope detox goes ok.  Iva Booparl E. Albie Bazin, MD, [facility] [phone] (pager) [phone] after 5 PM, weekends and holidays  09/20/2015 7:28 PM

## 2015-09-20 NOTE — BH Assessment (Addendum)
Tele Assessment Note   Chad Orozco is an 35 y.o. male that presents this date for excessive alcohol use and withdrawals. Patient reports that  He has recently lost his employment and has been increasing his ETOH intake for the last three days. Patient stated he has been consuming 10 to 12 12 oz beers with last reported use being this date where he reported drinking 3 12 oz. beers prior to his ED admission. Patient has reported daily use for the last two years reporting he consumes at least 6 to 8 12 oz beers every day. Patient denies any thoughts of self harm or H/I but does report increased depression, decreased sleep and loss of appetite the last two weeks. Patient states he feels his increased ETOH use is associated with his increased generalized weakness and shortness of breath. Patient denies any health issues although he stated he did think he had some high blood pressure. He has also noticed that his urine has been darker in color and eyes have been yellow. Denies any other substance abuse. He is not suicidal or homicidal. Patient denies any inpatient/outpatient treatment and would like to investigate [facility] for rehab but he was referred to ED for medical clearance first. Case was staffed with Earlene Plater NP who agreed to have ED monitor patient for 24 hours and re-evaluate in the a.m. This Clinical research associate spoke with Water engineer at [facility] who consulted with Short MD who felt patient's withdrawal symptoms were severe enough to require continuous monitoring in the ED. Patient will be re-evaluated in the a.m. To determine disposition.         Diagnosis: 303.90 Alcohol Dependency  Past Medical History:  Past Medical History  Diagnosis Date  . Hypertension     History reviewed. No pertinent past surgical history.  Family History: History reviewed. No pertinent family history.  Social History:  reports that he has been smoking Cigarettes.  He has been smoking about 0.30 packs per day. He has never used  smokeless tobacco. He reports that he drinks about 12.0 oz of alcohol per week. He reports that he does not use illicit drugs.  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 beers  CIWA: CIWA-Ar BP: 151/89 mmHg Pulse Rate: 118 Nausea and Vomiting: 2 Tactile Disturbances: mild itching, pins and needles, burning or numbness Tremor: two Auditory Disturbances: not present Paroxysmal Sweats: two Visual Disturbances: not present Anxiety: three Headache, Fullness in Head: none present Agitation: three Orientation and Clouding of Sensorium: oriented and can do serial additions CIWA-Ar Total: 14 COWS:    PATIENT STRENGTHS: (choose at least two) Ability for insight Average or above average intelligence  Allergies: No Known Allergies  Home Medications:  (Not in a hospital admission)  OB/GYN Status:  No LMP for male patient.  General Assessment Data Location of Assessment: [facility] TTS Assessment: In system Is this a Tele or Face-to-Face Assessment?: Tele Assessment Is this an Initial Assessment or a Re-assessment for this encounter?:  Initial Assessment Marital status: Single Maiden name: Na Is patient pregnant?: No Pregnancy Status: No Living Arrangements: Alone Can pt return to current living arrangement?: Yes Admission Status: Voluntary Is patient capable of signing voluntary admission?: Yes Referral Source: Self/Family/Friend Insurance type: None  Medical  Screening Exam [facility] Walk-in ONLY) Medical Exam completed: Yes  Crisis Care Plan Living Arrangements: Alone Legal Guardian: Other: (NA) Name of Psychiatrist: None Name of Therapist: None  Education Status Is patient currently in school?: No Current Grade: NA Highest grade of school patient has completed: 2 years college Name of school: [employer] Contact person: NA  Risk to self with the past 6 months Suicidal Ideation: No Has patient been a risk to self within the past 6 months prior to admission? : No Suicidal Intent: No Has patient had any suicidal intent within the past 6 months prior to admission? : No Is patient at risk for suicide?: No Suicidal Plan?: No Has patient had any suicidal plan within the past 6 months prior to admission? : No Access to Means: No What has been your use of drugs/alcohol within the last 12 months?: Current  Previous Attempts/Gestures: No How many times?: 0 Other Self Harm Risks: None Triggers for Past Attempts: Unknown Intentional Self Injurious Behavior: None Family Suicide History: No Recent stressful life event(s): Job Loss Persecutory voices/beliefs?: No Depression: Yes Depression Symptoms: Loss of interest in usual pleasures, Fatigue Substance abuse history and/or treatment for substance abuse?: No Suicide prevention information given to non-admitted patients: Not applicable  Risk to Others within the past 6 months Homicidal Ideation: No Does patient have any lifetime risk of violence toward others beyond the six months prior to admission? : No Thoughts of Harm to Others: No Current Homicidal Intent: No Current Homicidal Plan: No Access to Homicidal Means: No Identified Victim: Na History of harm to others?: No Assessment of Violence: None Noted Violent Behavior Description: NA Does patient have access to weapons?: No Criminal Charges Pending?: No Does patient have a court date: No Is patient on probation?:  No  Psychosis Hallucinations: None noted Delusions: None noted  Mental Status Report Appearance/Hygiene: Unremarkable Eye Contact: Fair Motor Activity: Unsteady Speech: Logical/coherent Level of Consciousness: Alert Mood: Depressed, Anxious Affect: Anxious, Depressed Anxiety Level: Minimal Thought Processes: Coherent, Relevant Judgement: Unimpaired Orientation: Person, Place, Time Obsessive Compulsive Thoughts/Behaviors: None  Cognitive Functioning Concentration: Normal Memory: Recent Intact, Remote Intact IQ: Average Insight: Good Impulse Control: Fair Appetite: Fair Weight Loss: 0 Weight Gain: 0 Sleep: Decreased Total Hours of Sleep: 3 Vegetative Symptoms: None  ADLScreening [facility] Assessment Services) Patient's cognitive ability adequate to safely complete daily activities?: Yes Patient able to express need for assistance with ADLs?: Yes Independently performs ADLs?: Yes (appropriate for developmental age)  Prior Inpatient Therapy Prior Inpatient Therapy: No Prior Therapy Dates: NA Prior Therapy Facilty/Provider(s): None Reason for Treatment: NA  Prior Outpatient Therapy Prior Outpatient Therapy: No Prior Therapy Dates: Na Prior Therapy Facilty/Provider(s): None Reason for Treatment: NA Does patient have an ACCT team?: No Does patient have Intensive In-House Services?  : No Does patient have [facility] services? : No Does patient have P4CC services?: No  ADL Screening (condition at time of admission) Patient's cognitive ability adequate to safely complete daily activities?: Yes Is the patient deaf or have difficulty hearing?: No Does the patient have difficulty seeing, even when wearing glasses/contacts?: No Does the patient have difficulty concentrating, remembering, or making decisions?: No Patient able to express need for assistance with ADLs?: Yes Does the patient have  difficulty dressing or bathing?: No Independently performs ADLs?: Yes (appropriate for  developmental age) Does the patient have difficulty walking or climbing stairs?: No Weakness of Legs: None Weakness of Arms/Hands: None  Home Assistive Devices/Equipment Home Assistive Devices/Equipment: None  Therapy Consults (therapy consults require a physician order) PT Evaluation Needed: No OT Evalulation Needed: No SLP Evaluation Needed: No Abuse/Neglect Assessment (Assessment to be complete while patient is alone) Physical Abuse: Denies Verbal Abuse: Denies Sexual Abuse: Denies Exploitation of patient/patient's resources: Denies Self-Neglect: Denies Values / Beliefs Cultural Requests During Hospitalization: None Spiritual Requests During Hospitalization: None Consults Spiritual Care Consult Needed: No Social Work Consult Needed: No Merchant navy officer (For Healthcare) Does patient have an advance directive?: No Would patient like information on creating an advanced directive?: No - patient declined information Nutrition Screen- [facility] Patient's home diet: Regular Has the patient recently lost weight without trying?: No Has the patient been eating poorly because of a decreased appetite?: Yes Malnutrition Screening Tool Score: 1  Additional Information 1:1 In Past 12 Months?: No CIRT Risk: No Elopement Risk: No Does patient have medical clearance?: Yes     Disposition:  Disposition Initial Assessment Completed for this Encounter: Yes Disposition of Patient: Outpatient treatment (Observation Unit)  Alfredia Ferguson 09/20/2015 4:40 PM

## 2015-09-20 NOTE — ED Notes (Signed)
Hospitalist/Short at bedside.

## 2015-09-20 NOTE — ED Notes (Addendum)
Pt reports SOB, fatigue and lightheadedness for the past 2 weeks. Symptoms have not changed in the last 2 weeks. Denies any pain. Pt also reports etoh abuse, last drink an hour ago. Denies SI/HI or any other substance use. Friend reports pt has withdrawn for the past week. Pt seen for the same symptoms 2 weeks ago and left prior to being seen.

## 2015-09-20 NOTE — H&P (Addendum)
[facility] History and Physical  Chad MallickKyle Orozco HYQ:[id number] DOB: 12/12/1980 DOA: 09/20/2015  Referring physician:  Raeford RazorStephen Kohut PCP:  No primary care provider on file.   Chief Complaint:  Forced to come to hospital by friends  HPI:  The patient is a 35 y.o. year-old male with history of elevated blood pressures, GERD, tobacco use, and alcohol abuse who presents with increasing fatigue, abdominal distension, yellowing of eyes.  He reports that he normally drinks 5-6 tallboys per day and has for several years.  He has multiple loose brown stools per day and has chronic nausea with intermittent vomiting which he reported to me as nonbloody and not like coffee grounds.  Per the GI note, however, he reported to them that for the last two days he has had bloody emesis.  He developed strep throat in January and since then he has not felt well.  He has had increased fatigue, worsening abdominal distension.  Over the last week, he has increased his alcohol consumption to include a fifth of whiskey and some occasional wine on top of what he normally drinks.  He has not taken any tylenol recently.  He denies history of IVDA or sharing crack pipes although he does occasionally snort cocaine.  His skin has turned bronzed, his urine has turned dark, and his eyes have turned yellow.  He denies abdominal pain.  He quit drinking alcohol several years ago and did not have symptoms of withdrawal per his report.  Today, however, he is worried about alcohol withdrawal because although his last drink was at 6am today, he has already had some "panic attacks" and received a total of 25mg  of valium since this morning.  He has been a little dyspneic for the last few weeks without wheezing, frequent cough or fevers.  He denies chest pains.  Has sensation that food gets stuck in his throat, but denies hematemesis to me and denies dark, tarry stools, or blood in his stools.    In the ER, VS notable for tachycardia to the  120s and mildly elevated blood pressures tos the 140s-160s.  WBC 15, platelets 121.  Sodium 132, potassium 2.7, glucose 230.  AST 285 and ALT 62, total bilirubin 11.4, INR 1.71.  Alcohol level at 11AM was 354.  UA concerning for >1000 glucose and large bilirubin.  Limited abdominal US demonstrated no gallstones.  He had layering gallbladder sludge with mild thickening of the gallbladder wall and mild ascites.  CBd was normal.  He had suggestion of fatty liver or chronic hepatitis.  No color flow was identified in the portal vein.     Review of Systems:  General:  Denies fevers, chills, weight loss or gain HEENT:  Denies changes to hearing and vision, rhinorrhea, sinus congestion, sore throat.  Has had bleeding gums and tooth CV:  Denies chest pain and palpitations, trace bilateral lower extremity edema.  PULM:  Postiive SOB, denies wheezing, cough.   GI:  Per HPI GU:  Denies dysuria, frequency, urgency ENDO:  Positive polyuria, polydipsia.   HEME:  Denies hematemesis, blood in stools, melena, abnormal bruising or bleeding.  LYMPH:  Denies lymphadenopathy.   MSK:  Denies arthralgias, myalgias.   DERM:  Denies skin rash or ulcer.   NEURO:  Denies focal numbness, weakness, slurred speech, confusion, facial droop.  PSYCH:  Denies anxiety and depression.    Past Medical History  Diagnosis Date  . Hypertension   . Alcohol abuse   . Cigarette nicotine dependence   .  GERD (gastroesophageal reflux disease)    Past Surgical History  Procedure Laterality Date  . Wisdom tooth extraction     Social History:  reports that he has been smoking Cigarettes.  He has been smoking about 0.30 packs per day. He has never used smokeless tobacco. He reports that he drinks about 12.0 oz of alcohol per week. He reports that he uses illicit drugs (Cocaine). Has sober family members in [place].  Has a brother who is local who is also an alcoholic.  No Known Allergies  Family History  Problem Relation Age of  Onset  . Depression Neg Hx   . Alcoholism Father   . Alcohol abuse Brother   . Alcohol abuse Brother   . High blood pressure Father   . High blood pressure Brother   . High Cholesterol Father   . High Cholesterol Brother      Prior to Admission medications   Medication Sig Start Date End Date Taking? Authorizing Provider  Multiple Vitamins-Minerals (CENTRUM MEN) TABS Take 1 tablet by mouth daily.   Yes Historical Provider, MD   Physical Exam: Filed Vitals:   09/20/15 1730 09/20/15 1735 09/20/15 1741 09/20/15 1808  BP: 146/73 147/73    Pulse: 124 123    Temp:   98.2 F (36.8 C)   TempSrc:   Oral   Resp: 18     Weight:    89.6 kg (197 lb 8.5 oz)  SpO2: 95%        General:  Adult male, NAD  Eyes:  PERRL, scleral icterus present, injected.  ENT:  Nares clear.  OP with smooth tongue, inflamed gums with some white plaques on gums.    Neck:  Supple without TM or JVD.    Lymph:  No cervical, supraclavicular, or submandibular LAD.  Cardiovascular:  Tachycardic, RR, normal S1, S2, without m/r/g.  2+ pulses, warm extremities  Respiratory:  CTA bilaterally without increased WOB.  Abdomen:  NABS.  Soft, moderately distended but not tense, + hepatomegaly, no focal tenderness.    Skin:  Jaundiced.  No rashes or focal lesions.  Musculoskeletal:  Normal bulk and tone.  Trace bilateral LE edema.  Psychiatric:  A & O x 4.  Appropriate affect.  Neurologic:  CN 3-12 intact.  5/5 strength.  Sensation intact.  Labs on Admission:  Basic Metabolic Panel:  Recent Labs Lab 09/20/15 1056  NA 132*  K 2.7*  CL 87*  CO2 28  GLUCOSE 230*  BUN <5*  CREATININE 0.39*  CALCIUM 8.0*  MG 1.8   Liver Function Tests:  Recent Labs Lab 09/20/15 1056  AST 285*  ALT 62  ALKPHOS 229*  BILITOT 11.4*  PROT 7.4  ALBUMIN 3.1*    Recent Labs Lab 09/20/15 1056  LIPASE 166*   No results for input(s): AMMONIA in the last 168 hours. CBC:  Recent Labs Lab 09/20/15 1001  WBC  15.0*  NEUTROABS 12.6*  HGB 14.6  HCT 40.6  MCV 91.0  PLT 121*   Cardiac Enzymes: No results for input(s): CKTOTAL, CKMB, CKMBINDEX, TROPONINI in the last 168 hours.  BNP (last 3 results) No results for input(s): BNP in the last 8760 hours.  ProBNP (last 3 results) No results for input(s): PROBNP in the last 8760 hours.  CBG: No results for input(s): GLUCAP in the last 168 hours.  Radiological Exams on Admission: US Abdomen Limited  09/20/2015  CLINICAL DATA:  Abnormal LFTs, ETOH abuse EXAM: US ABDOMEN LIMITED - RIGHT UPPER QUADRANT COMPARISON:  None. FINDINGS: Gallbladder: No shadowing gallstones are noted within gallbladder. Layering gallbladder sludge. Mild thickening of gallbladder wall up to 3.7 mm. No sonographic Murphy's sign. Common bile duct: Diameter: 4 mm in diameter within normal limits. Liver: There is coarse increased echogenicity of the liver suspicious for fatty infiltration or chronic hepatitis. Small perihepatic ascites. No focal hepatic mass. No color flow is identified within portal vein. IMPRESSION: 1. No shadowing gallstones are noted within gallbladder. Layering gallbladder sludge is noted. Mild thickening of gallbladder wall up to 3.7 mm without sonographic Murphy's sign. Normal CBD. 2. There is coarse increased echogenicity of the liver suspicious for fatty infiltration or chronic hepatitis. Small perihepatic ascites. No focal hepatic mass. No color flow is identified within portal vein. Electronically Signed   By: Natasha Mead M.D.   On: 09/20/2015 13:43    EKG: Independently reviewed. 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   -  CLD and antiemetics -  IVF -  Avoid hepatotoxins such as tylenol and statins -  Repeat CMP and INR in AM -  HBV, HCV testing -  HIV -   Ammonia level  Lack of color flow in portal vein could suggest portal vein thrombosis, however, he is pain free -  Consider additional imaging if he develops RUQ pain  Hematemesis, reported to GI -  EGD at some point -  CLD -  Protonix gtt  Elevated blood pressures, likely hypertension -  Start BB  Alcohol withdrawal, anticipate significant withdrawal since he has already had symptoms despite a high alcohol level  -  CIWA q2h with thiamine, folate and MVI  Gingivitis and possible thrush with bleeding tooth and gums -  Peridex mouth wash -  Clotrimazole troches  Hyponatremia, hypokalemia due to alcohol abuse -  Check phosphorus -  Replace with oral and IV supplementation -  Telemetry until electrolytes corrected  Hyperglycemia and likely diabetes - A1c - start low dose SSI  Palpitations and SOB for last few weeks, at risk for alcoholic cardiomyopathy -  ECHO -  Telemetry -  TSH  Diet:  CLD Access:  PIV IVF:  yes Proph:  SCDs (given reported hematemesis)  Code Status: full Family Communication: patient alone Disposition Plan: Admit to stepdown  Time spent: 60 min Chad Orozco [facility] Pager [phone]  If 7PM-7AM, please contact night-coverage [email or web address] Password [email or web address] 09/20/2015, 6:30 PM

## 2015-09-20 NOTE — ED Notes (Signed)
Telepsych in process 

## 2015-09-20 NOTE — ED Notes (Signed)
US at bedside; will administer valium with complete of procedure.

## 2015-09-20 NOTE — ED Notes (Signed)
Per Jerilynn BirkenheadStephanie Clemmons will reattempt blood draw.

## 2015-09-20 NOTE — ED Notes (Signed)
MADE FIRST REQUEST FOR URINE PT UNABLE TO PROVIDE ONE AT THIS TIME,PT GIVEN WATER TO HELP

## 2015-09-21 ENCOUNTER — Inpatient Hospital Stay (HOSPITAL_COMMUNITY): Payer: Medicaid - Out of State

## 2015-09-21 DIAGNOSIS — F10939 Alcohol use, unspecified with withdrawal, unspecified: Secondary | ICD-10-CM | POA: Diagnosis present

## 2015-09-21 DIAGNOSIS — R002 Palpitations: Secondary | ICD-10-CM

## 2015-09-21 DIAGNOSIS — E876 Hypokalemia: Secondary | ICD-10-CM | POA: Diagnosis present

## 2015-09-21 DIAGNOSIS — K7010 Alcoholic hepatitis without ascites: Secondary | ICD-10-CM | POA: Diagnosis present

## 2015-09-21 DIAGNOSIS — E871 Hypo-osmolality and hyponatremia: Secondary | ICD-10-CM

## 2015-09-21 DIAGNOSIS — F10239 Alcohol dependence with withdrawal, unspecified: Secondary | ICD-10-CM | POA: Diagnosis present

## 2015-09-21 DIAGNOSIS — K7011 Alcoholic hepatitis with ascites: Secondary | ICD-10-CM | POA: Diagnosis present

## 2015-09-21 DIAGNOSIS — K920 Hematemesis: Secondary | ICD-10-CM | POA: Diagnosis present

## 2015-09-21 LAB — COMPREHENSIVE METABOLIC PANEL
ALBUMIN: 2.9 g/dL — AB (ref 3.5–5.0)
ALK PHOS: 233 U/L — AB (ref 38–126)
ALT: 65 U/L — AB (ref 17–63)
ANION GAP: 14 (ref 5–15)
AST: 274 U/L — ABNORMAL HIGH (ref 15–41)
BUN: <5 mg/dL — ABNORMAL LOW (ref 6–20)
CHLORIDE: 93 mmol/L — AB (ref 101–111)
CO2: 26 mmol/L (ref 22–32)
Calcium: 8.1 mg/dL — ABNORMAL LOW (ref 8.9–10.3)
Creatinine, Ser: <0.3 mg/dL — ABNORMAL LOW (ref 0.61–1.24)
GLUCOSE: 197 mg/dL — AB (ref 65–99)
Potassium: 3.8 mmol/L (ref 3.5–5.1)
SODIUM: 133 mmol/L — AB (ref 135–145)
Total Bilirubin: 13.3 mg/dL — ABNORMAL HIGH (ref 0.3–1.2)
Total Protein: 7.2 g/dL (ref 6.5–8.1)

## 2015-09-21 LAB — CBC
HEMATOCRIT: 39.2 % (ref 39.0–52.0)
Hemoglobin: 13.8 g/dL (ref 13.0–17.0)
MCH: 32.9 pg (ref 26.0–34.0)
MCHC: 35.2 g/dL (ref 30.0–36.0)
MCV: 93.3 fL (ref 78.0–100.0)
Platelets: 94 10*3/uL — ABNORMAL LOW (ref 150–400)
RBC: 4.2 MIL/uL — AB (ref 4.22–5.81)
RDW: 15.5 % (ref 11.5–15.5)
WBC: 15.2 10*3/uL — AB (ref 4.0–10.5)

## 2015-09-21 LAB — HEPATITIS PANEL, ACUTE
HCV Ab: <0.1 s/co ratio (ref 0.0–0.9)
Hep A IgM: NEGATIVE
Hep B C IgM: NEGATIVE
Hepatitis B Surface Ag: NEGATIVE

## 2015-09-21 LAB — HIV ANTIBODY (ROUTINE TESTING W REFLEX): HIV SCREEN 4TH GENERATION: NONREACTIVE

## 2015-09-21 LAB — GLUCOSE, CAPILLARY
GLUCOSE-CAPILLARY: 169 mg/dL — AB (ref 65–99)
GLUCOSE-CAPILLARY: 116 mg/dL — AB (ref 65–99)

## 2015-09-21 LAB — ECHOCARDIOGRAM COMPLETE
HEIGHTINCHES: 67 in
WEIGHTICAEL: 3312.19 [oz_av]

## 2015-09-21 LAB — MAGNESIUM: Magnesium: 1.6 mg/dL — ABNORMAL LOW (ref 1.7–2.4)

## 2015-09-21 LAB — PHOSPHORUS: Phosphorus: 2 mg/dL — ABNORMAL LOW (ref 2.5–4.6)

## 2015-09-21 LAB — HEMOGLOBIN A1C
Hgb A1c MFr Bld: 9.4 % — ABNORMAL HIGH (ref 4.8–5.6)
Mean Plasma Glucose: 223 mg/dL

## 2015-09-21 LAB — PROTIME-INR
INR: 1.69 — AB (ref 0.00–1.49)
Prothrombin Time: 19.3 seconds — ABNORMAL HIGH (ref 11.6–15.2)

## 2015-09-21 MED ORDER — LACTULOSE 10 GM/15ML PO SOLN
10.0000 g | Freq: Two times a day (BID) | ORAL | Status: DC
  Administered 2015-09-21 – 2015-09-22 (×4): 10 g via ORAL
  Filled 2015-09-21 (×5): qty 15

## 2015-09-21 MED ORDER — SODIUM CHLORIDE 0.9 % IV SOLN
INTRAVENOUS | Status: DC
  Administered 2015-09-21 (×2): via INTRAVENOUS

## 2015-09-21 MED ORDER — MAGNESIUM SULFATE 4 GM/100ML IV SOLN
4.0000 g | Freq: Once | INTRAVENOUS | Status: AC
  Administered 2015-09-21: 4 g via INTRAVENOUS
  Filled 2015-09-21: qty 100

## 2015-09-21 MED ORDER — POTASSIUM PHOSPHATE MONOBASIC 500 MG PO TABS
500.0000 mg | ORAL_TABLET | Freq: Three times a day (TID) | ORAL | Status: DC
  Administered 2015-09-21 – 2015-09-22 (×7): 500 mg via ORAL
  Filled 2015-09-21 (×13): qty 1

## 2015-09-21 MED ORDER — MAGNESIUM SULFATE 2 GM/50ML IV SOLN
2.0000 g | Freq: Once | INTRAVENOUS | Status: AC
  Administered 2015-09-21: 2 g via INTRAVENOUS
  Filled 2015-09-21: qty 50

## 2015-09-21 MED ORDER — LIP MEDEX EX OINT
TOPICAL_OINTMENT | CUTANEOUS | Status: AC
  Administered 2015-09-21: 05:00:00
  Filled 2015-09-21: qty 7

## 2015-09-21 NOTE — Progress Notes (Addendum)
[facility] PROGRESS NOTE  Nasser Ku ZOX:[id number] DOB: 09/23/1980 DOA: 09/20/2015 PCP: No primary care provider on file.  Brief Summary  The patient is a 35 y.o. year-old male with history of elevated blood pressures, GERD, tobacco use, and alcohol abuse who presented with alcoholic hepatitis, hematemesis, and alcohol withdrawal. In the ER, VS notable for tachycardia to the 120s and mildly elevated blood pressures tos the 140s-160s. WBC 15, platelets 121. Sodium 132, potassium 2.7, glucose 230. AST 285 and ALT 62, total bilirubin 11.4, INR 1.71. Alcohol level at 11AM was 354. UA concerning for >1000 glucose and large bilirubin. Limited abdominal US demonstrated no gallstones. He had layering gallbladder sludge with mild thickening of the gallbladder wall and mild ascites. CBd was normal. He had suggestion of fatty liver or chronic hepatitis. No color flow was identified in the portal vein.   Assessment/Plan  Likely alcoholic hepatitis, Maddrey score of 31 yesterday and 32 today -  Consider steroids if worsening - CLD and antiemetics - IVF -  LFTs and INR unchanged, bilirubin rising  - Avoid hepatotoxins such as tylenol and statins -  Hepatitis A, B, and C negative >> will need vaccination prior to discharge - HIV NR - Ammonia level 79 > start lactulose BID to prevent hepatic encephalopathy -  May need some pancrelipase when diet advanced given history of chronic soft/frequent stools  Lack of color flow in portal vein could suggest portal vein thrombosis, however, he is pain free - Consider additional imaging if he develops RUQ pain  Hematemesis, reported to GI - EGD at some point - CLD - Protonix gtt  Elevated blood pressures, likely hypertension, blood pressure improved - continue BB  Alcohol withdrawal, anticipate significant withdrawal since he has already had symptoms despite a high alcohol level  - CIWA q2h with thiamine, folate and  MVI  Gingivitis and possible thrush with bleeding tooth and gums - Peridex mouth wash - Clotrimazole troches -  Needs follow up with dentistry  Hyponatremia, hypokalemia, and hypophosphatemia due to alcohol abuse - Replace with oral and IV supplementation - Telemetry until electrolytes corrected  Diabetes mellitus type 2, A1c 9.4 - start low dose SSI -  Diabetic educator and nutrition consultation  -  RN to educate about diabetes also   Palpitations and SOB for last few weeks, at risk for alcoholic cardiomyopathy - ECHO:  Mild MR and TR, otherwise wnl.  Preserved EF - Telemetry:  NSR - TSH 1.211  Hyponatremia due to cirrhosis and hepatitis -  Trend sodium, asymptomatic for now  Thrombocytopenia due to cirrhosis and acute illness -  plt trending down  Diet: FLD Access: PIV IVF: yes Proph: SCDs (given reported hematemesis)  Code Status: full Family Communication: patient alone Disposition Plan:  If not developing symptoms of stepdown   Consultants:  Gastroenterology, Dr. Leone Payor  Procedures:  Abd [place]  Antibiotics:  none   HPI/Subjective:  Feels a little better today.  Denies anxiety, tremor.  + nausea without vomiting.  No BM since admission.  Abdomen still swollen.  Does not want to try to eat, does not feel hungry.  Worried about palpitations and still a little SOB    Objective: Filed Vitals:   09/21/15 0600 09/21/15 0800 09/21/15 1000 09/21/15 1200  BP: 138/86 117/82 128/80 124/75  Pulse: 93 93 92 85  Temp:  98 F (36.7 C)  97.8 F (36.6 C)  TempSrc:  Oral  Oral  Resp: Height:  Weight:      SpO2: 93% 92% 93% 94%    Intake/Output Summary (Last 24 hours) at 09/21/15 1442 Last data filed at 09/21/15 1300  Gross per 24 hour  Intake 2591.67 ml  Output   1250 ml  Net 1341.67 ml   Filed Weights   09/20/15 1808 09/21/15 0342  Weight: 89.6 kg (197 lb 8.5 oz) 93.9 kg (207 lb 0.2 oz)   Body mass index is 32.42  kg/(m^2).  Exam:   General:  Jaundiced male, No acute distress  HEENT:  NCAT, MMM, scleral icterus  Cardiovascular:  RRR, nl S1, S2 no mrg, 2+ pulses, warm extremities  Respiratory:  Diminished at the bilateral bases, no wheezes, rales, or rhonchi, no increased WOB  Abdomen:   NABS, soft, moderately distended, not tense, nontender  MSK:   Normal tone and bulk, trace bilateral ankle edema  Neuro:  Grossly intact  Data Reviewed: Basic Metabolic Panel:  Recent Labs Lab 09/20/15 1056 09/21/15 0337  NA 132* 133*  K 2.7* 3.8  CL 87* 93*  CO2 28 26  GLUCOSE 230* 197*  BUN <5* <5*  CREATININE 0.39* <0.30*  CALCIUM 8.0* 8.1*  MG 1.8 1.6*  PHOS  --  2.0*   Liver Function Tests:  Recent Labs Lab 09/20/15 1056 09/21/15 0337  AST 285* 274*  ALT 62 65*  ALKPHOS 229* 233*  BILITOT 11.4* 13.3*  PROT 7.4 7.2  ALBUMIN 3.1* 2.9*    Recent Labs Lab 09/20/15 1056  LIPASE 166*    Recent Labs Lab 09/20/15 1856  AMMONIA 79*   CBC:  Recent Labs Lab 09/20/15 1001 09/21/15 0337  WBC 15.0* 15.2*  NEUTROABS 12.6*  --   HGB 14.6 13.8  HCT 40.6 39.2  MCV 91.0 93.3  PLT 121* 94*    Recent Results (from the past 240 hour(s))  MRSA PCR Screening     Status: None   Collection Time: 09/20/15  6:22 PM  Result Value Ref Range Status   MRSA by PCR NEGATIVE NEGATIVE Final    Comment:        The GeneXpert MRSA Assay (FDA approved for NASAL specimens only), is one component of a comprehensive MRSA colonization surveillance program. It is not intended to diagnose MRSA infection nor to guide or monitor treatment for MRSA infections.      Studies: [place] Abdomen Limited  09/20/2015  CLINICAL DATA:  Abnormal LFTs, ETOH abuse EXAM: US ABDOMEN LIMITED - RIGHT UPPER QUADRANT COMPARISON:  None. FINDINGS: Gallbladder: No shadowing gallstones are noted within gallbladder. Layering gallbladder sludge. Mild thickening of gallbladder wall up to 3.7 mm. No sonographic Murphy's  sign. Common bile duct: Diameter: 4 mm in diameter within normal limits. Liver: There is coarse increased echogenicity of the liver suspicious for fatty infiltration or chronic hepatitis. Small perihepatic ascites. No focal hepatic mass. No color flow is identified within portal vein. IMPRESSION: 1. No shadowing gallstones are noted within gallbladder. Layering gallbladder sludge is noted. Mild thickening of gallbladder wall up to 3.7 mm without sonographic Murphy's sign. Normal CBD. 2. There is coarse increased echogenicity of the liver suspicious for fatty infiltration or chronic hepatitis. Small perihepatic ascites. No focal hepatic mass. No color flow is identified within portal vein. Electronically Signed   By: Natasha MeadLiviu  Pop M.D.   On: 09/20/2015 13:43    Scheduled Meds: . chlorhexidine  15 mL Mouth/Throat BID  . clotrimazole  10 mg Oral 5 X Daily  . folic acid  1 mg Oral Daily  .  insulin aspart  0-5 Units Subcutaneous QHS  . insulin aspart  0-9 Units Subcutaneous TID WC  . lactulose  10 g Oral BID  . magnesium chloride  1 tablet Oral BID  . multivitamin with minerals  1 tablet Oral Daily  . [START ON 09/24/2015] pantoprazole (PROTONIX) IV  40 mg Intravenous Q12H  . potassium phosphate (monobasic)  500 mg Oral TID WC & HS  . propranolol  20 mg Oral TID  . sodium chloride flush  3 mL Intravenous Q12H  . thiamine  100 mg Oral Daily   Continuous Infusions: . sodium chloride 100 mL/hr at 09/21/15 0950  . pantoprozole (PROTONIX) infusion 8 mg/hr (09/21/15 1300)    Active Problems:   Hepatitis    Time spent: 30 min    Gerson Fauth  [facility] Pager [phone]. If 7PM-7AM, please contact night-coverage at [email or web address], password [facility] 09/21/2015, 2:42 PM  LOS: 1 day

## 2015-09-21 NOTE — Progress Notes (Signed)
  Echocardiogram 2D Echocardiogram has been performed.  Chad Orozco, [name] M 09/21/2015, 11:38 AM

## 2015-09-21 NOTE — Progress Notes (Signed)
     [facility] Progress Note  Subjective:   Labs this morning with total bili 13.3 up from 11.4, alkaline phosphatase 233, ALT 65, AST 274.   CBC white count 15.2. Platelets 94,000. INR 1.69.No complaints. Denies feeling shaky. Was able to sleep.  Objective:  Vital signs in last 24 hours: Temp:  [97.7 F (36.5 C)-99.4 F (37.4 C)] 98 F (36.7 C) (03/11 0800) Pulse Rate:  [90-140] 92 (03/11 1000) Resp:  [13-23] 18 (03/11 1000) BP: (116-165)/(60-117) 128/80 mmHg (03/11 1000) SpO2:  [90 %-98 %] 93 % (03/11 1000) Weight:  [197 lb 8.5 oz (89.6 kg)-207 lb 0.2 oz (93.9 kg)] 207 lb 0.2 oz (93.9 kg) (03/11 0342) Last BM Date: 09/20/15 General:   Alert,  Well-developed,    in NAD Heart:  Regular rate and rhythm; no murmurs Pulm;lungs clear Abdomen:   Distended, tense,,nontender, BS active,nonpalp mass or hsm.  Extremities:  Without edema. Neurologic: Alert and  oriented x4;  grossly normal neurologically.No asterixis. Psych: Alert and cooperative. Normal mood and affect.  Intake/Output from previous day: 03/10 0701 - 03/11 0700 In: 1601.7 [P.O.:150; I.V.:1351.7; IV Piggyback:100] Out: 1250 [Urine:1250] Intake/Output this shift: Total I/O In: 495 [P.O.:120; I.V.:225; IV Piggyback:150] Out: -   Lab Results:  Recent Labs  09/20/15 1001 09/21/15 0337  WBC 15.0* 15.2*  HGB 14.6 13.8  HCT 40.6 39.2  PLT 121* 94*   BMET  Recent Labs  09/20/15 1056 09/21/15 0337  NA 132* 133*  K 2.7* 3.8  CL 87* 93*  CO2 28 26  GLUCOSE 230* 197*  BUN <5* <5*  CREATININE 0.39* <0.30*  CALCIUM 8.0* 8.1*   LFT  Recent Labs  09/21/15 0337  PROT 7.2  ALBUMIN 2.9*  AST 274*  ALT 65*  ALKPHOS 233*  BILITOT 13.3*   PT/INR  Recent Labs  09/20/15 1001 09/21/15 0337  LABPROT 19.4* 19.3*  INR 1.71* 1.69*   Hepatitis Panel  Recent Labs  09/20/15 1055  HEPBSAG Negative  HCVAB <0.1  HEPAIGM Negative  HEPBIGM Negative      ASSESSMENT/PLAN:  35 yo male  admitted with alcoholic hepatitis, ascites. Continue detox. Reported reflux and hematemesis prior to admission--will need EGD at some point.  Hypertension Hyperglycemia    LOS: 1 day   Hvozdovic, Lori P PA-C 09/21/2015, Pager [phone] Mon-Fri 8a-5p [phone] after 5p, weekends, holidays    [facility] Attending   I have taken an interval history, reviewed the chart and examined the patient. I agree with the Advanced Practitioner's note, impression and recommendations.    Alcoholic hepatitis Alcoholism Hx hematemesis ? Dysphagia - none here and NL Hgb  Anticipate EGD when off ETOH w/o problem Dietitian should see when more alert and able to interact Recalculate discriminant function soon - ? Steroid Tx??  Iva Booparl E. Britt Petroni, MD, [facility]  Gastroenterology [phone]) [phone] after 5 PM, weekends and holidays  09/21/2015 3:11 PM

## 2015-09-21 NOTE — Progress Notes (Signed)
Utilization review completed.  

## 2015-09-22 ENCOUNTER — Encounter (HOSPITAL_COMMUNITY): Payer: Self-pay | Admitting: Internal Medicine

## 2015-09-22 DIAGNOSIS — R739 Hyperglycemia, unspecified: Secondary | ICD-10-CM | POA: Insufficient documentation

## 2015-09-22 DIAGNOSIS — E1165 Type 2 diabetes mellitus with hyperglycemia: Secondary | ICD-10-CM

## 2015-09-22 DIAGNOSIS — IMO0002 Reserved for concepts with insufficient information to code with codable children: Secondary | ICD-10-CM

## 2015-09-22 HISTORY — DX: Reserved for concepts with insufficient information to code with codable children: IMO0002

## 2015-09-22 HISTORY — DX: Type 2 diabetes mellitus with hyperglycemia: E11.65

## 2015-09-22 LAB — COMPREHENSIVE METABOLIC PANEL
ALK PHOS: 216 U/L — AB (ref 38–126)
ALT: 49 U/L (ref 17–63)
ANION GAP: 10 (ref 5–15)
AST: 216 U/L — ABNORMAL HIGH (ref 15–41)
Albumin: 2.6 g/dL — ABNORMAL LOW (ref 3.5–5.0)
BILIRUBIN TOTAL: 16.3 mg/dL — AB (ref 0.3–1.2)
BUN: 7 mg/dL (ref 6–20)
CALCIUM: 8.3 mg/dL — AB (ref 8.9–10.3)
CO2: 25 mmol/L (ref 22–32)
Chloride: 103 mmol/L (ref 101–111)
Creatinine, Ser: 0.34 mg/dL — ABNORMAL LOW (ref 0.61–1.24)
GFR calc non Af Amer: >60 mL/min (ref >60)
Glucose, Bld: 153 mg/dL — ABNORMAL HIGH (ref 65–99)
Potassium: 3.8 mmol/L (ref 3.5–5.1)
SODIUM: 138 mmol/L (ref 135–145)
TOTAL PROTEIN: 6.6 g/dL (ref 6.5–8.1)

## 2015-09-22 LAB — PROTIME-INR
INR: 1.68 — AB (ref 0.00–1.49)
PROTHROMBIN TIME: 19.8 s — AB (ref 11.6–15.2)

## 2015-09-22 LAB — GLUCOSE, CAPILLARY
GLUCOSE-CAPILLARY: 150 mg/dL — AB (ref 65–99)
GLUCOSE-CAPILLARY: 147 mg/dL — AB (ref 65–99)
GLUCOSE-CAPILLARY: 133 mg/dL — AB (ref 65–99)
Glucose-Capillary: 92 mg/dL (ref 65–99)
Glucose-Capillary: 111 mg/dL — ABNORMAL HIGH (ref 65–99)

## 2015-09-22 LAB — CBC
HCT: 38 % — ABNORMAL LOW (ref 39.0–52.0)
Hemoglobin: 13.6 g/dL (ref 13.0–17.0)
MCH: 33.3 pg (ref 26.0–34.0)
MCHC: 35.8 g/dL (ref 30.0–36.0)
MCV: 92.9 fL (ref 78.0–100.0)
PLATELETS: 87 10*3/uL — AB (ref 150–400)
RBC: 4.09 MIL/uL — AB (ref 4.22–5.81)
RDW: 16 % — ABNORMAL HIGH (ref 11.5–15.5)
WBC: 15.2 10*3/uL — AB (ref 4.0–10.5)

## 2015-09-22 LAB — HEPATITIS PANEL, ACUTE
HEP B S AG: NEGATIVE
Hep A IgM: NEGATIVE
Hep B C IgM: NEGATIVE

## 2015-09-22 LAB — PHOSPHORUS: PHOSPHORUS: 2 mg/dL — AB (ref 2.5–4.6)

## 2015-09-22 LAB — MAGNESIUM: MAGNESIUM: 2.3 mg/dL (ref 1.7–2.4)

## 2015-09-22 MED ORDER — PREDNISOLONE 5 MG PO TABS
40.0000 mg | ORAL_TABLET | Freq: Every day | ORAL | Status: DC
  Administered 2015-09-22: 40 mg via ORAL
  Filled 2015-09-22: qty 8

## 2015-09-22 MED ORDER — ONDANSETRON HCL 4 MG/2ML IJ SOLN
4.0000 mg | Freq: Four times a day (QID) | INTRAMUSCULAR | Status: DC | PRN
  Administered 2015-09-22 – 2015-09-23 (×2): 4 mg via INTRAVENOUS
  Filled 2015-09-22 (×2): qty 2

## 2015-09-22 MED ORDER — LACTULOSE 10 GM/15ML PO SOLN
10.0000 g | Freq: Every day | ORAL | Status: DC | PRN
  Filled 2015-09-22: qty 15

## 2015-09-22 NOTE — Progress Notes (Signed)
[facility] PROGRESS NOTE  Chad MallickKyle Orozco ZDG:[id number] DOB: 05/27/1981 DOA: 09/20/2015 PCP: No primary care provider on file.  Brief Summary  The patient is a 35 y.o. year-old male with history of elevated blood pressures, GERD, tobacco use, and alcohol abuse who presented with alcoholic hepatitis, hematemesis, and alcohol withdrawal. In the ER, VS notable for tachycardia to the 120s and mildly elevated blood pressures tos the 140s-160s. WBC 15, platelets 121. Sodium 132, potassium 2.7, glucose 230. AST 285 and ALT 62, total bilirubin 11.4, INR 1.71. Alcohol level at 11AM was 354. UA concerning for >1000 glucose and large bilirubin. Limited abdominal US demonstrated no gallstones. He had layering gallbladder sludge with mild thickening of the gallbladder wall and mild ascites. CBd was normal. He had suggestion of fatty liver or chronic hepatitis. No color flow was identified in the portal vein.   Assessment/Plan  Likely alcoholic hepatitis, initial Maddrey discriminant score of 31, however, due to rising bilirubin, score is now 37 -  Consider steroids post EGD - FLD, then NPO at MN for EGD -  LFTs and INR unchanged, bilirubin rising  - Avoid hepatotoxins such as tylenol and statins -  Hepatitis A, B, and C negative >> will need vaccination prior to discharge - HIV NR - Ammonia level 79 > continue lactulose BID with additional PRN dose if no BM -  May need some pancrelipase when diet advanced given history of chronic soft/frequent stools  Lack of color flow in portal vein could suggest portal vein thrombosis, however, he is pain free - Consider additional imaging if he develops RUQ pain  Hematemesis, reported to GI - EGD tomorrow - Protonix gtt  Elevated blood pressures, likely hypertension, blood pressure improved - continue BB  Alcohol withdrawal, no significant symptoms of withdrawal in last 12 hours - CIWA q2h with thiamine, folate and MVI  Gingivitis  and possible thrush with bleeding tooth and gums - Peridex mouth wash - Clotrimazole troches -  Needs follow up with dentistry  Hyponatremia, hypokalemia, and hypophosphatemia due to alcohol abuse - Replace with oral and IV supplementation - Telemetry until electrolytes corrected  Diabetes mellitus type 2, A1c 9.4 - start low dose SSI -  Diabetic educator and nutrition consultation  -  RN to educate about diabetes also   Palpitations and SOB for last few weeks, at risk for alcoholic cardiomyopathy - ECHO:  Mild MR and TR, otherwise wnl.  Preserved EF - Telemetry:  NSR - TSH 1.211  Hyponatremia due to cirrhosis and hepatitis -  Trend sodium, asymptomatic for now  Thrombocytopenia due to cirrhosis and acute illness -  plt trending down  Hypophosphatemia, increase supplementation  Diet: FLD Access: PIV IVF: off Proph: SCDs (given reported hematemesis)  Code Status: full Family Communication: patient alone Disposition Plan:  Transfer to telemetry.  Awaiting EGD.  Needs to have signs of improvement in bilirubin, tolerating diet prior to discharge   Consultants:  Gastroenterology, Dr. Leone PayorGessner  Procedures:  Abd US  Antibiotics:  none   HPI/Subjective:  Denies anxiety, tremor.  + nausea without vomiting.  No BM since.  Abdomen still swollen.  Feels like thinking is not very clear today.   Objective: Filed Vitals:   09/22/15 0500 09/22/15 0600 09/22/15 0757 09/22/15 0800  BP:  109/61  114/57  Pulse:  88  83  Temp:   98.2 F (36.8 C)   TempSrc:   Oral   Resp:  16  18  Height:      Weight: 93.2  kg (205 lb 7.5 oz)     SpO2:  92%  93%    Intake/Output Summary (Last 24 hours) at 09/22/15 0925 Last data filed at 09/22/15 0600  Gross per 24 hour  Intake   2840 ml  Output    550 ml  Net   2290 ml   Filed Weights   09/20/15 1808 09/21/15 0342 09/22/15 0500  Weight: 89.6 kg (197 lb 8.5 oz) 93.9 kg (207 lb 0.2 oz) 93.2 kg (205 lb 7.5 oz)   Body  mass index is 32.17 kg/(m^2).  Exam:   General:  Increasingly jaundiced male, No acute distress  HEENT:  NCAT, dark blood in mouth, staining teeth, scleral icterus  Cardiovascular:  RRR, nl S1, S2 no mrg, 2+ pulses, warm extremities  Respiratory:  Diminished at the bilateral bases, no wheezes, rales, or rhonchi, no increased WOB  Abdomen:   NABS, soft, moderately distended, not tense, nontender  MSK:   Normal tone and bulk, trace bilateral ankle edema  Neuro:  Grossly moves all extremities  Data Reviewed: Basic Metabolic Panel:  Recent Labs Lab 09/20/15 1056 09/21/15 0337 09/22/15 0416  NA 132* 133* 138  K 2.7* 3.8 3.8  CL 87* 93* 103  CO2 GLUCOSE 230* 197* 153*  BUN <5* <5* 7  CREATININE 0.39* <0.30* 0.34*  CALCIUM 8.0* 8.1* 8.3*  MG 1.8 1.6* 2.3  PHOS  --  2.0* 2.0*   Liver Function Tests:  Recent Labs Lab 09/20/15 1056 09/21/15 0337 09/22/15 0416  AST 285* 274* 216*  ALT 62 65* 49  ALKPHOS 229* 233* 216*  BILITOT 11.4* 13.3* 16.3*  PROT 7.4 7.2 6.6  ALBUMIN 3.1* 2.9* 2.6*    Recent Labs Lab 09/20/15 1056  LIPASE 166*    Recent Labs Lab 09/20/15 1856  AMMONIA 79*   CBC:  Recent Labs Lab 09/20/15 1001 09/21/15 0337 09/22/15 0416  WBC 15.0* 15.2* 15.2*  NEUTROABS 12.6*  --   --   HGB 14.6 13.8 13.6  HCT 40.6 39.2 38.0*  MCV 91.0 93.3 92.9  PLT 121* 94* 87*    Recent Results (from the past 240 hour(s))  MRSA PCR Screening     Status: None   Collection Time: 09/20/15  6:22 PM  Result Value Ref Range Status   MRSA by PCR NEGATIVE NEGATIVE Final    Comment:        The GeneXpert MRSA Assay (FDA approved for NASAL specimens only), is one component of a comprehensive MRSA colonization surveillance program. It is not intended to diagnose MRSA infection nor to guide or monitor treatment for MRSA infections.      Studies: [place] Abdomen Limited  09/20/2015  CLINICAL DATA:  Abnormal LFTs, ETOH abuse EXAM: US ABDOMEN LIMITED  - RIGHT UPPER QUADRANT COMPARISON:  None. FINDINGS: Gallbladder: No shadowing gallstones are noted within gallbladder. Layering gallbladder sludge. Mild thickening of gallbladder wall up to 3.7 mm. No sonographic Murphy's sign. Common bile duct: Diameter: 4 mm in diameter within normal limits. Liver: There is coarse increased echogenicity of the liver suspicious for fatty infiltration or chronic hepatitis. Small perihepatic ascites. No focal hepatic mass. No color flow is identified within portal vein. IMPRESSION: 1. No shadowing gallstones are noted within gallbladder. Layering gallbladder sludge is noted. Mild thickening of gallbladder wall up to 3.7 mm without sonographic Murphy's sign. Normal CBD. 2. There is coarse increased echogenicity of the liver suspicious for fatty infiltration or chronic hepatitis. Small perihepatic ascites. No focal hepatic  mass. No color flow is identified within portal vein. Electronically Signed   By: Natasha Mead M.D.   On: 09/20/2015 13:43    Scheduled Meds: . chlorhexidine  15 mL Mouth/Throat BID  . clotrimazole  10 mg Oral 5 X Daily  . folic acid  1 mg Oral Daily  . insulin aspart  0-5 Units Subcutaneous QHS  . insulin aspart  0-9 Units Subcutaneous TID WC  . lactulose  10 g Oral BID  . magnesium chloride  1 tablet Oral BID  . multivitamin with minerals  1 tablet Oral Daily  . [START ON 09/24/2015] pantoprazole (PROTONIX) IV  40 mg Intravenous Q12H  . potassium phosphate (monobasic)  500 mg Oral TID WC & HS  . prednisoLONE  40 mg Oral Daily  . propranolol  20 mg Oral TID  . sodium chloride flush  3 mL Intravenous Q12H  . thiamine  100 mg Oral Daily   Continuous Infusions: . pantoprozole (PROTONIX) infusion 8 mg/hr (09/22/15 0600)    Principal Problem:   Alcoholic hepatitis Active Problems:   Hepatitis   Alcohol withdrawal (HCC)   Hyponatremia   Hypomagnesemia   Hypokalemia   Alcoholic hepatitis with ascites   Hematemesis without nausea    Time  spent: 30 min    Mars Scheaffer  [facility] Pager [phone]. If 7PM-7AM, please contact night-coverage at [email or web address], password [facility] 09/22/2015, 9:25 AM  LOS: 2 days

## 2015-09-22 NOTE — Progress Notes (Signed)
     [facility] Progress Note  Subjective:   Prothrombin time 19.8, bili 16.3, discriminant function today approx 37. He reports diffuse abdominal discomfort. Nauseous today but has not vomited. He says anxiety fairly well controlled with current regimen.    Objective:  Vital signs in last 24 hours: Temp:  [97.8 F (36.6 C)-99.6 F (37.6 C)] 98.2 F (36.8 C) (03/12 0757) Pulse Rate:  [83-92] 83 (03/12 0800) Resp:  [16-22] 18 (03/12 0800) BP: (109-154)/(57-90) 114/57 mmHg (03/12 0800) SpO2:  [90 %-96 %] 93 % (03/12 0800) Weight:  [205 lb 7.5 oz (93.2 kg)] 205 lb 7.5 oz (93.2 kg) (03/12 0500) Last BM Date: 09/20/15 General:   Alert,  Well-developed,    in NAD Heart:  Regular rate and rhythm; no murmurs Pulm; diminished breath sounds at bases Abdomen:  Soft, distended, mild diffuse tenderness to palpation, Normal bowel sounds, without guarding, and without rebound.   Extremities:  Without edema. Neurologic  Alert and  oriented x4;  grossly normal neurologically. no asterixis  Psych:  Alert and cooperative. Normal mood and affect.  Intake/Output from previous day: 03/11 0701 - 03/12 0700 In: 2990 [P.O.:240; I.V.:2600; IV Piggyback:150] Out: 550 [Urine:550] Intake/Output this shift:    Lab Results:  Recent Labs  09/20/15 1001 09/21/15 0337 09/22/15 0416  WBC 15.0* 15.2* 15.2*  HGB 14.6 13.8 13.6  HCT 40.6 39.2 38.0*  PLT 121* 94* 87*   BMET  Recent Labs  09/20/15 1056 09/21/15 0337 09/22/15 0416  NA 132* 133* 138  K 2.7* 3.8 3.8  CL 87* 93* 103  CO2 28 26 25  GLUCOSE 230* 197* 153*  BUN <5* <5* 7  CREATININE 0.39* <0.30* 0.34*  CALCIUM 8.0* 8.1* 8.3*   LFT  Recent Labs  09/22/15 0416  PROT 6.6  ALBUMIN 2.6*  AST 216*  ALT 49  ALKPHOS 216*  BILITOT 16.3*   PT/INR  Recent Labs  09/21/15 0337 09/22/15 0416  LABPROT 19.3* 19.8*  INR 1.69* 1.68*   Hepatitis Panel  Recent Labs  09/21/15 0337  HEPBSAG Negative  HCVAB <0.1    HEPAIGM Negative  HEPBIGM Negative      ASSESSMENT/PLAN:  34-year-old male admitted with alcoholic hepatitis and ascites, continue detox. Patient reports several months of reflux and several episodes of hematemesis prior to admission. Will plan on EGD tomorrow to evaluate for varices, esophagitis, gastritis, ulcer, etc. Discriminant function this morning approximately 37. Will consider initiation of steroids pending findings of EGD tomorrow. Recheck ammonia.     LOS: 2 days   Hvozdovic, Lori P PA-C 09/22/2015, Pager [phone] Mon-Fri 8a-5p [phone] after 5p, weekends, holidays    [facility] Attending   I have taken an interval history, reviewed the chart and examined the patient. I agree with the Advanced Practitioner's note, impression and recommendations.   Alcoholic hepatitis Alcoholism Hx hematemesis and vague dysphagia - NL Hgb Diabetes mellitus Hgb A1 C 9.4 % - could have some bearing on whether to use steroids - this is a new dx (DM)  Seems improved clinically - #'s worse so he does qualify for prednisolone Tx Doubt serious bleeding but will see what an EGD shows before starting steroids.  Nikeria Kalman E. Bryttani Blew, MD, FACG [facility] [phone]) [phone] after 5 PM, weekends and holidays  09/22/2015 11:46 AM  

## 2015-09-23 ENCOUNTER — Inpatient Hospital Stay (HOSPITAL_COMMUNITY): Payer: Medicaid - Out of State | Admitting: Registered Nurse

## 2015-09-23 ENCOUNTER — Encounter (HOSPITAL_COMMUNITY): Payer: Self-pay

## 2015-09-23 ENCOUNTER — Encounter (HOSPITAL_COMMUNITY)
Admission: EM | Disposition: A | Discharge status: Home / Self Care | Payer: Self-pay | Source: Home / Self Care | Attending: Internal Medicine

## 2015-09-23 DIAGNOSIS — K3189 Other diseases of stomach and duodenum: Secondary | ICD-10-CM

## 2015-09-23 HISTORY — PX: ESOPHAGOGASTRODUODENOSCOPY (EGD) WITH PROPOFOL: SHX5813

## 2015-09-23 LAB — AMMONIA: AMMONIA: 71 umol/L — AB (ref 9–35)

## 2015-09-23 LAB — CBC
HEMATOCRIT: 38.5 % — AB (ref 39.0–52.0)
HEMOGLOBIN: 13.1 g/dL (ref 13.0–17.0)
MCH: 33.2 pg (ref 26.0–34.0)
MCHC: 34 g/dL (ref 30.0–36.0)
MCV: 97.5 fL (ref 78.0–100.0)
Platelets: 131 10*3/uL — ABNORMAL LOW (ref 150–400)
RBC: 3.95 MIL/uL — AB (ref 4.22–5.81)
RDW: 17.1 % — ABNORMAL HIGH (ref 11.5–15.5)
WBC: 17.2 10*3/uL — AB (ref 4.0–10.5)

## 2015-09-23 LAB — GLUCOSE, CAPILLARY
GLUCOSE-CAPILLARY: 146 mg/dL — AB (ref 65–99)
Glucose-Capillary: 107 mg/dL — ABNORMAL HIGH (ref 65–99)
Glucose-Capillary: 126 mg/dL — ABNORMAL HIGH (ref 65–99)
Glucose-Capillary: 152 mg/dL — ABNORMAL HIGH (ref 65–99)

## 2015-09-23 LAB — COMPREHENSIVE METABOLIC PANEL
ALT: 48 U/L (ref 17–63)
ANION GAP: 10 (ref 5–15)
AST: 182 U/L — ABNORMAL HIGH (ref 15–41)
Albumin: 2.3 g/dL — ABNORMAL LOW (ref 3.5–5.0)
Alkaline Phosphatase: 190 U/L — ABNORMAL HIGH (ref 38–126)
BUN: 13 mg/dL (ref 6–20)
CHLORIDE: 103 mmol/L (ref 101–111)
CO2: 26 mmol/L (ref 22–32)
Calcium: 8.3 mg/dL — ABNORMAL LOW (ref 8.9–10.3)
Creatinine, Ser: 0.54 mg/dL — ABNORMAL LOW (ref 0.61–1.24)
GFR calc non Af Amer: >60 mL/min (ref >60)
Glucose, Bld: 128 mg/dL — ABNORMAL HIGH (ref 65–99)
Potassium: 4.1 mmol/L (ref 3.5–5.1)
SODIUM: 139 mmol/L (ref 135–145)
Total Bilirubin: 18.2 mg/dL (ref 0.3–1.2)
Total Protein: 6.5 g/dL (ref 6.5–8.1)

## 2015-09-23 LAB — PHOSPHORUS: Phosphorus: 3.5 mg/dL (ref 2.5–4.6)

## 2015-09-23 LAB — PROTIME-INR
INR: 1.64 — AB (ref 0.00–1.49)
Prothrombin Time: 19.5 seconds — ABNORMAL HIGH (ref 11.6–15.2)

## 2015-09-23 LAB — MAGNESIUM: Magnesium: 2.2 mg/dL (ref 1.7–2.4)

## 2015-09-23 SURGERY — ESOPHAGOGASTRODUODENOSCOPY (EGD) WITH PROPOFOL
Anesthesia: Monitor Anesthesia Care

## 2015-09-23 MED ORDER — LIDOCAINE HCL (CARDIAC) 20 MG/ML IV SOLN
INTRAVENOUS | Status: AC
  Filled 2015-09-23: qty 5

## 2015-09-23 MED ORDER — GLYCOPYRROLATE 0.2 MG/ML IJ SOLN
INTRAMUSCULAR | Status: DC | PRN
  Administered 2015-09-23: 0.2 mg via INTRAVENOUS

## 2015-09-23 MED ORDER — PROPRANOLOL HCL 10 MG PO TABS
10.0000 mg | ORAL_TABLET | Freq: Three times a day (TID) | ORAL | Status: DC
  Administered 2015-09-23 – 2015-09-28 (×14): 10 mg via ORAL
  Filled 2015-09-23 (×15): qty 1

## 2015-09-23 MED ORDER — ONDANSETRON HCL 4 MG/2ML IJ SOLN
INTRAMUSCULAR | Status: DC | PRN
  Administered 2015-09-23: 4 mg via INTRAVENOUS

## 2015-09-23 MED ORDER — PROPOFOL 10 MG/ML IV BOLUS
INTRAVENOUS | Status: AC
  Filled 2015-09-23: qty 40

## 2015-09-23 MED ORDER — POTASSIUM CHLORIDE IN NACL 20-0.45 MEQ/L-% IV SOLN
INTRAVENOUS | Status: DC
  Administered 2015-09-23: 09:00:00 via INTRAVENOUS
  Filled 2015-09-23 (×2): qty 1000

## 2015-09-23 MED ORDER — PANTOPRAZOLE SODIUM 40 MG PO TBEC
40.0000 mg | DELAYED_RELEASE_TABLET | Freq: Every day | ORAL | Status: DC
  Administered 2015-09-23 – 2015-09-28 (×6): 40 mg via ORAL
  Filled 2015-09-23 (×9): qty 1

## 2015-09-23 MED ORDER — LACTATED RINGERS IV SOLN: INTRAVENOUS | Status: DC

## 2015-09-23 MED ORDER — LACTULOSE 10 GM/15ML PO SOLN
30.0000 g | Freq: Two times a day (BID) | ORAL | Status: DC
  Administered 2015-09-23 – 2015-09-24 (×3): 30 g via ORAL
  Filled 2015-09-23 (×3): qty 45

## 2015-09-23 MED ORDER — ONDANSETRON HCL 4 MG/2ML IJ SOLN
INTRAMUSCULAR | Status: AC
  Filled 2015-09-23: qty 2

## 2015-09-23 MED ORDER — LIDOCAINE HCL (CARDIAC) 20 MG/ML IV SOLN
INTRAVENOUS | Status: DC | PRN
  Administered 2015-09-23: 50 mg via INTRAVENOUS

## 2015-09-23 MED ORDER — PROPOFOL 500 MG/50ML IV EMUL
INTRAVENOUS | Status: DC | PRN
Start: 2015-09-23 — End: 2015-09-23
  Administered 2015-09-23: 200 ug/kg/min via INTRAVENOUS

## 2015-09-23 MED ORDER — VITAMINS A & D EX OINT
TOPICAL_OINTMENT | CUTANEOUS | Status: AC
  Administered 2015-09-23: 1
  Filled 2015-09-23: qty 5

## 2015-09-23 MED ORDER — LACTATED RINGERS IV SOLN
INTRAVENOUS | Status: DC | PRN
  Administered 2015-09-23: 11:00:00 via INTRAVENOUS

## 2015-09-23 MED ORDER — LACTULOSE 10 GM/15ML PO SOLN
30.0000 g | Freq: Every day | ORAL | Status: DC | PRN
  Filled 2015-09-23: qty 45

## 2015-09-23 MED ORDER — GLYCOPYRROLATE 0.2 MG/ML IJ SOLN
INTRAMUSCULAR | Status: AC
  Filled 2015-09-23: qty 1

## 2015-09-23 MED ORDER — PREDNISOLONE 5 MG PO TABS
40.0000 mg | ORAL_TABLET | Freq: Every day | ORAL | Status: DC
  Administered 2015-09-23 – 2015-09-28 (×6): 40 mg via ORAL
  Filled 2015-09-23 (×7): qty 8

## 2015-09-23 MED ORDER — ONDANSETRON HCL 4 MG/2ML IJ SOLN
4.0000 mg | Freq: Two times a day (BID) | INTRAMUSCULAR | Status: DC
  Administered 2015-09-23 – 2015-09-28 (×11): 4 mg via INTRAVENOUS
  Filled 2015-09-23 (×9): qty 2

## 2015-09-23 MED ORDER — LACTATED RINGERS IV SOLN
INTRAVENOUS | Status: DC
  Administered 2015-09-23: 11:00:00 via INTRAVENOUS

## 2015-09-23 MED ORDER — SODIUM CHLORIDE 0.9 % IV SOLN: INTRAVENOUS | Status: DC

## 2015-09-23 SURGICAL SUPPLY — 14 items

## 2015-09-23 NOTE — Interval H&P Note (Signed)
History and Physical Interval Note:  09/23/2015 10:53 AM  Chad MallickKyle Balch  has presented today for surgery, with the diagnosis of hematemesis  The various methods of treatment have been discussed with the patient and family. After consideration of risks, benefits and other options for treatment, the patient has consented to  Procedure(s): ESOPHAGOGASTRODUODENOSCOPY (EGD) WITH PROPOFOL (N/A) as a surgical intervention .  The patient's history has been reviewed, patient examined, no change in status, stable for surgery.  I have reviewed the patient's chart and labs.  Questions were answered to the patient's satisfaction.     Rachael FeeJacobs, Atonya Templer P

## 2015-09-23 NOTE — Anesthesia Postprocedure Evaluation (Signed)
Anesthesia Post Note  Patient: Chad Orozco  Procedure(s) Performed: Procedure(s) (LRB): ESOPHAGOGASTRODUODENOSCOPY (EGD) WITH PROPOFOL (N/A)  Patient location during evaluation: PACU Anesthesia Type: MAC Level of consciousness: awake and alert Pain management: pain level controlled Vital Signs Assessment: post-procedure vital signs reviewed and stable Respiratory status: spontaneous breathing, nonlabored ventilation, respiratory function stable and patient connected to nasal cannula oxygen Cardiovascular status: stable and blood pressure returned to baseline Anesthetic complications: no    Last Vitals:  Filed Vitals:   09/23/15 1140 09/23/15 1150  BP: 94/45   Pulse: 83 85  Temp:    Resp: 13 14    Last Pain:  Filed Vitals:   09/23/15 1150  PainSc: 0-No pain                 Reino KentJudd, Tyesha Joffe J

## 2015-09-23 NOTE — Progress Notes (Signed)
     [facility] Progress Note  Subjective:  T blil 18.2. alh phos 190, alt 48, ast 182,prothrombin time 19.5. Less shaky today,feels very tired. No N/V. No abd pain.   Objective:  Vital signs in last 24 hours: Temp:  [97.8 F (36.6 C)-100 F (37.8 C)] 98.7 F (37.1 C) (03/13 0802) Pulse Rate:  [78-88] 80 (03/13 0802) Resp:  [16-20] 18 (03/13 0605) BP: (107-145)/(66-91) 107/68 mmHg (03/13 0802) SpO2:  [91 %-95 %] 94 % (03/13 0802) Weight:  [207 lb 0.2 oz (93.9 kg)] 207 lb 0.2 oz (93.9 kg) (03/13 0605) Last BM Date: 09/20/15 General:   Alert,  Well-developed,    in NAD Heart:  Regular rate and rhythm; no murmurs Pulm;lungs clear Abdomen:  Soft, mild diffuse TTP, ,distended. Normal bowel sounds, without guarding, and without rebound.   Extremities:  Without edema. Neurologic:  Alert and  oriented x4;  grossly normal neurologically.no asterixis. Psych:  Alert and cooperative. Normal mood and affect.  Intake/Output from previous day: 03/12 0701 - 03/13 0700 In: 960 [P.O.:360; I.V.:600] Out: 500 [Urine:500] Intake/Output this shift: Total I/O In: 0  Out: 350 [Urine:350]  Lab Results:  Recent Labs  09/21/15 0337 09/22/15 0416 09/23/15 0501  WBC 15.2* 15.2* 17.2*  HGB 13.8 13.6 13.1  HCT 39.2 38.0* 38.5*  PLT 94* 87* 131*   BMET  Recent Labs  09/21/15 0337 09/22/15 0416 09/23/15 0501  NA 133* 138 139  K 3.8 3.8 4.1  CL 93* 103 103  CO2 26 25 26   GLUCOSE 197* 153* 128*  BUN <5* 7 13  CREATININE <0.30* 0.34* 0.54*  CALCIUM 8.1* 8.3* 8.3*   LFT  Recent Labs  09/23/15 0501  PROT 6.5  ALBUMIN 2.3*  AST 182*  ALT 48  ALKPHOS 190*  BILITOT 18.2*   PT/INR  Recent Labs  09/22/15 0416 09/23/15 0501  LABPROT 19.8* 19.5*  INR 1.68* 1.64*   Hepatitis Panel  Recent Labs  09/21/15 0337  HEPBSAG Negative  HCVAB <0.1  HEPAIGM Negative  HEPBIGM Negative   Ammonia 71.   ASSESSMENT/PLAN:  35 year old male admitted with alcoholic  hepatitis and ascites, continue detox. Patient reports several months of reflux and several episodes of hematemesis prior to admission. For EGD today. Discriminant function 38. ? Addition of steroids pending findings of EGD. (and A1C 9.4)    LOS: 3 days   Hvozdovic, Lori P PA-C 09/23/2015, Pager [phone] Mon-Fri 8a-5p [phone] after 5p, weekends, holidays

## 2015-09-23 NOTE — Op Note (Signed)
[facility] Name: Chad MallickKyle Orozco Procedure Date: 09/23/2015 MRN: [id number] Attending MD: Rachael Feeaniel P Johanna Matto , MD Date of Birth: 03/06/1981 CSN:  Age: 35 Admit Type: Inpatient Account #: [id number] Procedure:                Upper GI endoscopy Indications:              Nausea with vomiting, has acute severe alcoholic                            hepatitis Providers:                Rachael Feeaniel P. Melainie Krinsky, MD, Dwain SarnaPatricia Ford, RN, Lorenda IshiharaSam                            Tetteh, Technician, Anastasio ChampionJanet Evans, CRNA Referring MD:             Stan Headarl Gessner, MD Medicines:                Monitored Anesthesia Care Complications:            No immediate complications. Estimated Blood Loss:     Estimated blood loss: none. Procedure:                Pre-Anesthesia Assessment:                           - Prior to the procedure, a History and Physical                            was performed, and patient medications and                            allergies were reviewed. The patient's tolerance of                            previous anesthesia was also reviewed. The risks                            and benefits of the procedure and the sedation                            options and risks were discussed with the patient.                            All questions were answered, and informed consent                            was obtained. Prior Anticoagulants: The patient has                            taken no previous anticoagulant or antiplatelet                            agents. ASA Grade Assessment: III - A patient with  severe systemic disease. After reviewing the risks                            and benefits, the patient was deemed in                            satisfactory condition to undergo the procedure.                           After obtaining informed consent, the endoscope was                            passed under direct vision. Throughout the          procedure, the patient's blood pressure, pulse, and                            oxygen saturations were monitored continuously. The                            Endoscope was introduced through the mouth, and                            advanced to the third part of duodenum. The upper                            GI endoscopy was accomplished without difficulty.                            The patient tolerated the procedure well. Scope In: Scope Out: Findings:      The esophagus was normal.      The examined duodenum was normal.      Moderate portal hypertensive gastropathy was found in the entire       examined stomach.      The exam was otherwise without abnormality. Impression:               - Normal esophagus (no varices in esophagus or                            stomach).                           - Normal examined duodenum.                           - Portal hypertensive gastropathy.                           - The examination was otherwise normal.                           - No specimens collected. Moderate Sedation:      N/A- Per Anesthesia Care Recommendation:           - Return patient to hospital ward for ongoing care.                           -  Advance diet as tolerated (you need to eat!).                           - Will make sure he is on scheduled antiemetics                            (zofran)                           - Will start prednisolone  once daily for 4                            weeks for your alcoholic hepatitis.                           - You need to stop drinking alcohol. Procedure Code(s):        --- Professional ---                           [id number], Esophagogastroduodenoscopy, flexible,                            transoral; diagnostic, including collection of                            specimen(s) by brushing or washing, when performed                            (separate procedure) Diagnosis Code(s):        --- Professional ---                            K76.6, Portal hypertension                           K31.89, Other diseases of stomach and duodenum                           R11.2, Nausea with vomiting, unspecified CPT copyright 2016 [employer]. All rights reserved. The codes documented in this report are preliminary and upon coder review may  be revised to meet current compliance requirements. Rachael Fee, MD Rachael Fee, MD 09/23/2015 11:25:43 AM Number of Addenda: 0

## 2015-09-23 NOTE — Progress Notes (Signed)
CRITICAL VALUE ALERT  Critical value received:  Total Bilirubin: 18.2  Date of notification:  09/23/2015  Time of notification:  0542  Critical value read back:Yes.    Nurse who received alert:  Driscilla MoatsBriana Elvert Cumpton, RN  MD notified (1st page):  Benedetto Coons. Callahan, NP  Time of first page:  [phone]  MD notified (2nd page):  Time of second page:  Responding MD:  Benedetto Coons. Callahan, NP  Time MD responded:  [phone]  Told to make GI aware of the value when they come on at 0700.

## 2015-09-23 NOTE — Progress Notes (Signed)
[facility] PROGRESS NOTE  Milus MallickKyle Stanczyk UJW:[id number] DOB: 11/26/1980 DOA: 09/20/2015 PCP: No primary care provider on file.  Brief Summary  The patient is a 35 y.o. year-old male with history of elevated blood pressures, GERD, tobacco use, and alcohol abuse who presented with alcoholic hepatitis, hematemesis, and alcohol withdrawal. In the ER, VS notable for tachycardia to the 120s and mildly elevated blood pressures tos the 140s-160s. WBC 15, platelets 121. Sodium 132, potassium 2.7, glucose 230. AST 285 and ALT 62, total bilirubin 11.4, INR 1.71. Alcohol level at 11AM was 354. UA concerning for >1000 glucose and large bilirubin. Limited abdominal US demonstrated no gallstones. He had layering gallbladder sludge with mild thickening of the gallbladder wall and mild ascites. CBd was normal. He had suggestion of fatty liver or chronic hepatitis. No color flow was identified in the portal vein.   3/13:  EGD demonstrates moderate portal hypertensive gastropathy  Assessment/Plan  Likely alcoholic hepatitis, initial Maddrey discriminant score of 31, however, due to rising bilirubin, score is rising. -  Steroids started on 3/13 x 4 weeks -  LFTs and INR stable to mildly improved, bilirubin rising  - Avoid hepatotoxins such as tylenol and statins -  Hepatitis A, B, and C negative >> will need vaccination prior to discharge - HIV NR  Possible mild hepatic encephalopathy, no stools - Ammonia level 79  -  Increase lactulose  Lack of color flow in portal vein could suggest portal vein thrombosis, however, he is pain free - Consider additional imaging if he develops RUQ pain  Hematemesis secondary to moderate portal hypertensive gastropathy but no varices detected on EGD.   -  Diet advanced.  Continue PPI  Hypertension, likely hypertension, blood pressure improved - decrease BB  Alcohol withdrawal, no significant symptoms of withdrawal in last 12 hours - CIWA q2h with  thiamine, folate and MVI  Gingivitis and possible thrush with bleeding tooth and gums - Peridex mouth wash - Clotrimazole troches -  Needs follow up with dentistry  Hyponatremia, hypokalemia, and hypophosphatemia due to alcohol abuse - Replace with oral and IV supplementation - Telemetry until electrolytes corrected  Diabetes mellitus type 2, A1c 9.4 - start low dose SSI -  Diabetic educator and nutrition consultation  -  RN to educate about diabetes also   Palpitations and SOB for last few weeks, at risk for alcoholic cardiomyopathy - ECHO:  Mild MR and TR, otherwise wnl.  Preserved EF - Telemetry:  NSR - TSH 1.211  Hyponatremia due to cirrhosis and hepatitis, resolved with IVF  Thrombocytopenia due to cirrhosis and acute illness, improving  Hypophosphatemia, resolved with supplementation  Leukocytosis, likely secondary to hepatitis -  Monitor for other symptoms of infection to prompt further evaluation  Diet: diabetic/healthy heart Access: PIV IVF: off Proph: SCDs (given reported hematemesis)  Code Status: full Family Communication: patient alone Disposition Plan:  Needs to have signs of improvement in bilirubin, tolerating diet prior to discharge   Consultants:  Gastroenterology, Dr. Leone PayorGessner  Procedures:  Abd US  EGD  Antibiotics:  none   HPI/Subjective:  Denies anxiety, tremor.  + nausea without vomiting.  No BM since admission.  Abdomen still swollen.  Thinking is about the same.  Objective: Filed Vitals:   09/23/15 1130 09/23/15 1140 09/23/15 1150 09/23/15 1314  BP: 97/49 94/45  114/62  Pulse: 85 83 85 83  Temp:    98.7 F (37.1 C)  TempSrc:    Oral  Resp: 17 13 14 16   Height:  Weight:      SpO2: 100% 100% 98% 95%    Intake/Output Summary (Last 24 hours) at 09/23/15 1928 Last data filed at 09/23/15 1900  Gross per 24 hour  Intake   2635 ml  Output    850 ml  Net   1785 ml   Filed Weights   09/22/15 0500 09/23/15 0605  09/23/15 1052  Weight: 93.2 kg (205 lb 7.5 oz) 93.9 kg (207 lb 0.2 oz) 93.895 kg (207 lb)   Body mass index is 32.41 kg/(m^2).  Exam:   General:  Increasingly jaundiced male, No acute distress  HEENT:  NCAT, mouth clear of blood today  Cardiovascular:  RRR, nl S1, S2 no mrg, 2+ pulses, warm extremities  Respiratory:  Diminished at the bilateral bases, no wheezes, rales, or rhonchi, no increased WOB  Abdomen:   NABS, soft, moderately distended, not tense, nontender  MSK:   Normal tone and bulk, trace bilateral ankle edema  Neuro:  Grossly moves all extremities  Data Reviewed: Basic Metabolic Panel:  Recent Labs Lab 09/20/15 1056 09/21/15 0337 09/22/15 0416 09/23/15 0501  NA 132* 133* 138 139  K 2.7* 3.8 3.8 4.1  CL 87* 93* 103 103  CO2 GLUCOSE 230* 197* 153* 128*  BUN <5* <5* 7 13  CREATININE 0.39* <0.30* 0.34* 0.54*  CALCIUM 8.0* 8.1* 8.3* 8.3*  MG 1.8 1.6* 2.3 2.2  PHOS  --  2.0* 2.0* 3.5   Liver Function Tests:  Recent Labs Lab 09/20/15 1056 09/21/15 0337 09/22/15 0416 09/23/15 0501  AST 285* 274* 216* 182*  ALT 62 65* 49 48  ALKPHOS 229* 233* 216* 190*  BILITOT 11.4* 13.3* 16.3* 18.2*  PROT 7.4 7.2 6.6 6.5  ALBUMIN 3.1* 2.9* 2.6* 2.3*    Recent Labs Lab 09/20/15 1056  LIPASE 166*    Recent Labs Lab 09/20/15 1856 09/23/15 0501  AMMONIA 79* 71*   CBC:  Recent Labs Lab 09/20/15 1001 09/21/15 0337 09/22/15 0416 09/23/15 0501  WBC 15.0* 15.2* 15.2* 17.2*  NEUTROABS 12.6*  --   --   --   HGB 14.6 13.8 13.6 13.1  HCT 40.6 39.2 38.0* 38.5*  MCV 91.0 93.3 92.9 97.5  PLT 121* 94* 87* 131*    Recent Results (from the past 240 hour(s))  MRSA PCR Screening     Status: None   Collection Time: 09/20/15  6:22 PM  Result Value Ref Range Status   MRSA by PCR NEGATIVE NEGATIVE Final    Comment:        The GeneXpert MRSA Assay (FDA approved for NASAL specimens only), is one component of a comprehensive MRSA  colonization surveillance program. It is not intended to diagnose MRSA infection nor to guide or monitor treatment for MRSA infections.      Studies: No results found.  Scheduled Meds: . chlorhexidine  15 mL Mouth/Throat BID  . clotrimazole  10 mg Oral 5 X Daily  . folic acid  1 mg Oral Daily  . insulin aspart  0-5 Units Subcutaneous QHS  . insulin aspart  0-9 Units Subcutaneous TID WC  . lactulose  30 g Oral BID  . magnesium chloride  1 tablet Oral BID  . multivitamin with minerals  1 tablet Oral Daily  . ondansetron  4 mg Intravenous BID  . pantoprazole  40 mg Oral Q0600  . prednisoLONE  40 mg Oral Daily  . propranolol  20 mg Oral TID  . sodium chloride flush  3  mL Intravenous Q12H  . thiamine  100 mg Oral Daily   Continuous Infusions: . 0.45 % NaCl with KCl 20 mEq / L 75 mL/hr at 09/23/15 0856  . pantoprozole (PROTONIX) infusion 8 mg/hr (09/23/15 1537)    Principal Problem:   Alcoholic hepatitis Active Problems:   Hepatitis   Alcohol withdrawal (HCC)   Hyponatremia   Hypomagnesemia   Hypokalemia   Alcoholic hepatitis with ascites   Hematemesis without nausea   Diabetes mellitus type II, uncontrolled (HCC)   Hyperglycemia    Time spent: 30 min    Anaja Monts  [facility] Pager [phone]. If 7PM-7AM, please contact night-coverage at [email or web address], password [facility] 09/23/2015, 7:28 PM  LOS: 3 days

## 2015-09-23 NOTE — Anesthesia Preprocedure Evaluation (Addendum)
Anesthesia Evaluation  Patient identified by MRN, date of birth, ID band Patient awake    Reviewed: Allergy & Precautions, H&P , NPO status , Patient's Chart, lab work & pertinent test results  History of Anesthesia Complications Negative for: history of anesthetic complications  Airway Mallampati: III  TM Distance: >3 FB Neck ROM: full    Dental no notable dental hx. (+) Teeth Intact   Pulmonary Current Smoker,    Pulmonary exam normal breath sounds clear to auscultation       Cardiovascular hypertension, Normal cardiovascular exam Rhythm:regular Rate:Normal     Neuro/Psych negative neurological ROS     GI/Hepatic GERD  ,(+)     substance abuse  alcohol use, Hepatitis -  Endo/Other  negative endocrine ROSdiabetes  Renal/GU negative Renal ROS     Musculoskeletal   Abdominal   Peds  Hematology negative hematology ROS (+)   Anesthesia Other Findings Admitted with liver injury, likely alcoholicabuse related, has been on CIWA protocol but not needing ativan currently, denies nausea or any vomiting since admission   Scleral icterus, abdominal is distended but nontender  Reproductive/Obstetrics negative OB ROS                           Anesthesia Physical Anesthesia Plan  ASA: III  Anesthesia Plan: MAC   Post-op Pain Management:    Induction: Intravenous  Airway Management Planned: Nasal Cannula  Additional Equipment:   Intra-op Plan:   Post-operative Plan:   Informed Consent: I have reviewed the patients History and Physical, chart, labs and discussed the procedure including the risks, benefits and alternatives for the proposed anesthesia with the patient or authorized representative who has indicated his/her understanding and acceptance.   Dental Advisory Given  Plan Discussed with: Anesthesiologist, CRNA and Surgeon  Anesthesia Plan Comments:         Anesthesia  Quick Evaluation

## 2015-09-23 NOTE — Transfer of Care (Signed)
Immediate Anesthesia Transfer of Care Note  Patient: Chad Orozco  Procedure(s) Performed: Procedure(s): ESOPHAGOGASTRODUODENOSCOPY (EGD) WITH PROPOFOL (N/A)  Patient Location: PACU  Anesthesia Type:MAC  Level of Consciousness: awake, alert , oriented and patient cooperative  Airway & Oxygen Therapy: Patient Spontanous Breathing and Patient connected to nasal cannula oxygen  Post-op Assessment: Report given to RN, Post -op Vital signs reviewed and stable and Patient moving all extremities X 4  Post vital signs: stable  Last Vitals:  Filed Vitals:   09/23/15 1052 09/23/15 1129  BP: 118/68   Pulse: 81 84  Temp: 37.6 C   Resp: 18 18    Complications: No apparent anesthesia complications

## 2015-09-23 NOTE — H&P (View-Only) (Signed)
     [facility] Progress Note  Subjective:   Prothrombin time 19.8, bili 16.3, discriminant function today approx 37. He reports diffuse abdominal discomfort. Nauseous today but has not vomited. He says anxiety fairly well controlled with current regimen.    Objective:  Vital signs in last 24 hours: Temp:  [97.8 F (36.6 C)-99.6 F (37.6 C)] 98.2 F (36.8 C) (03/12 0757) Pulse Rate:  [83-92] 83 (03/12 0800) Resp:  [16-22] 18 (03/12 0800) BP: (109-154)/(57-90) 114/57 mmHg (03/12 0800) SpO2:  [90 %-96 %] 93 % (03/12 0800) Weight:  [205 lb 7.5 oz (93.2 kg)] 205 lb 7.5 oz (93.2 kg) (03/12 0500) Last BM Date: 09/20/15 General:   Alert,  Well-developed,    in NAD Heart:  Regular rate and rhythm; no murmurs Pulm; diminished breath sounds at bases Abdomen:  Soft, distended, mild diffuse tenderness to palpation, Normal bowel sounds, without guarding, and without rebound.   Extremities:  Without edema. Neurologic  Alert and  oriented x4;  grossly normal neurologically. no asterixis  Psych:  Alert and cooperative. Normal mood and affect.  Intake/Output from previous day: 03/11 0701 - 03/12 0700 In: 2990 [P.O.:240; I.V.:2600; IV Piggyback:150] Out: 550 [Urine:550] Intake/Output this shift:    Lab Results:  Recent Labs  09/20/15 1001 09/21/15 0337 09/22/15 0416  WBC 15.0* 15.2* 15.2*  HGB 14.6 13.8 13.6  HCT 40.6 39.2 38.0*  PLT 121* 94* 87*   BMET  Recent Labs  09/20/15 1056 09/21/15 0337 09/22/15 0416  NA 132* 133* 138  K 2.7* 3.8 3.8  CL 87* 93* 103  CO2 28 26 25   GLUCOSE 230* 197* 153*  BUN <5* <5* 7  CREATININE 0.39* <0.30* 0.34*  CALCIUM 8.0* 8.1* 8.3*   LFT  Recent Labs  09/22/15 0416  PROT 6.6  ALBUMIN 2.6*  AST 216*  ALT 49  ALKPHOS 216*  BILITOT 16.3*   PT/INR  Recent Labs  09/21/15 0337 09/22/15 0416  LABPROT 19.3* 19.8*  INR 1.69* 1.68*   Hepatitis Panel  Recent Labs  09/21/15 0337  HEPBSAG Negative  HCVAB <0.1    HEPAIGM Negative  HEPBIGM Negative      ASSESSMENT/PLAN:  35 year old male admitted with alcoholic hepatitis and ascites, continue detox. Patient reports several months of reflux and several episodes of hematemesis prior to admission. Will plan on EGD tomorrow to evaluate for varices, esophagitis, gastritis, ulcer, etc. Discriminant function this morning approximately 37. Will consider initiation of steroids pending findings of EGD tomorrow. Recheck ammonia.     LOS: 2 days   Hvozdovic, Tollie PizzaLori P PA-C 09/22/2015, Pager [phone] Mon-Fri 8a-5p [phone] after 5p, weekends, holidays    [facility] Attending   I have taken an interval history, reviewed the chart and examined the patient. I agree with the Advanced Practitioner's note, impression and recommendations.   Alcoholic hepatitis Alcoholism Hx hematemesis and vague dysphagia - NL Hgb Diabetes mellitus Hgb A1 C 9.4 % - could have some bearing on whether to use steroids - this is a new dx (DM)  Seems improved clinically - #'s worse so he does qualify for prednisolone Tx Doubt serious bleeding but will see what an EGD shows before starting steroids.  Iva Booparl E. Ralf Konopka, MD, [facility]AC[facility] [phone]) [phone] after 5 PM, weekends and holidays  09/22/2015 11:46 AM

## 2015-09-23 NOTE — Care Management Note (Signed)
Case Management Note  Patient Details  Name: Milus MallickKyle Garlington MRN: [id number] Date of Birth: 03/13/1981  Subjective/Objective:  35 y/o m admitted w/etoh hepatitis. From home.                  Action/Plan:d/c plan home.  Expected Discharge Date:   (unknown)               Expected Discharge Plan:  Home/Self Care  In-House Referral:     Discharge planning Services  CM Consult  Post Acute Care Choice:    Choice offered to:     DME Arranged:    DME Agency:     HH Arranged:    HH Agency:     Status of Service:  In process, will continue to follow  Medicare Important Message Given:    Date Medicare IM Given:    Medicare IM give by:    Date Additional Medicare IM Given:    Additional Medicare Important Message give by:     If discussed at Long Length of Stay Meetings, dates discussed:    Additional Comments:  Lanier ClamMahabir, Tamicka Shimon, RN 09/23/2015, 4:04 PM

## 2015-09-24 ENCOUNTER — Inpatient Hospital Stay (HOSPITAL_COMMUNITY): Payer: Medicaid - Out of State

## 2015-09-24 DIAGNOSIS — R0781 Pleurodynia: Secondary | ICD-10-CM | POA: Insufficient documentation

## 2015-09-24 LAB — COMPREHENSIVE METABOLIC PANEL
ALT: 60 U/L (ref 17–63)
AST: 189 U/L — ABNORMAL HIGH (ref 15–41)
Albumin: 2.3 g/dL — ABNORMAL LOW (ref 3.5–5.0)
Alkaline Phosphatase: 179 U/L — ABNORMAL HIGH (ref 38–126)
Anion gap: 10 (ref 5–15)
BUN: 10 mg/dL (ref 6–20)
CHLORIDE: 105 mmol/L (ref 101–111)
CO2: 25 mmol/L (ref 22–32)
CREATININE: 0.44 mg/dL — AB (ref 0.61–1.24)
Calcium: 8.5 mg/dL — ABNORMAL LOW (ref 8.9–10.3)
GFR calc Af Amer: >60 mL/min (ref >60)
GFR calc non Af Amer: >60 mL/min (ref >60)
GLUCOSE: 149 mg/dL — AB (ref 65–99)
POTASSIUM: 4.3 mmol/L (ref 3.5–5.1)
SODIUM: 140 mmol/L (ref 135–145)
Total Bilirubin: 19.9 mg/dL (ref 0.3–1.2)
Total Protein: 6.3 g/dL — ABNORMAL LOW (ref 6.5–8.1)

## 2015-09-24 LAB — GLUCOSE, CAPILLARY
GLUCOSE-CAPILLARY: 112 mg/dL — AB (ref 65–99)
GLUCOSE-CAPILLARY: 147 mg/dL — AB (ref 65–99)
Glucose-Capillary: 157 mg/dL — ABNORMAL HIGH (ref 65–99)
Glucose-Capillary: 157 mg/dL — ABNORMAL HIGH (ref 65–99)
Glucose-Capillary: 158 mg/dL — ABNORMAL HIGH (ref 65–99)

## 2015-09-24 LAB — URINALYSIS, ROUTINE W REFLEX MICROSCOPIC
Glucose, UA: NEGATIVE mg/dL
HGB URINE DIPSTICK: NEGATIVE
Ketones, ur: 15 mg/dL — AB
Nitrite: POSITIVE — AB
PROTEIN: NEGATIVE mg/dL
SPECIFIC GRAVITY, URINE: 1.027 (ref 1.005–1.030)
pH: 6.5 (ref 5.0–8.0)

## 2015-09-24 LAB — CBC
HCT: 40.6 % (ref 39.0–52.0)
HEMOGLOBIN: 13.6 g/dL (ref 13.0–17.0)
MCH: 32.9 pg (ref 26.0–34.0)
MCHC: 33.5 g/dL (ref 30.0–36.0)
MCV: 98.1 fL (ref 78.0–100.0)
Platelets: 170 10*3/uL (ref 150–400)
RBC: 4.14 MIL/uL — ABNORMAL LOW (ref 4.22–5.81)
RDW: 17.6 % — ABNORMAL HIGH (ref 11.5–15.5)
WBC: 14.6 10*3/uL — ABNORMAL HIGH (ref 4.0–10.5)

## 2015-09-24 LAB — PHOSPHORUS: Phosphorus: 3 mg/dL (ref 2.5–4.6)

## 2015-09-24 LAB — PROTIME-INR
INR: 1.7 — ABNORMAL HIGH (ref 0.00–1.49)
PROTHROMBIN TIME: 19.4 s — AB (ref 11.6–15.2)

## 2015-09-24 LAB — URINE MICROSCOPIC-ADD ON

## 2015-09-24 LAB — MAGNESIUM: Magnesium: 2.1 mg/dL (ref 1.7–2.4)

## 2015-09-24 MED ORDER — GLUCERNA SHAKE PO LIQD
237.0000 mL | Freq: Three times a day (TID) | ORAL | Status: DC
  Administered 2015-09-25 – 2015-09-28 (×9): 237 mL via ORAL
  Filled 2015-09-24 (×12): qty 237

## 2015-09-24 MED ORDER — LACTULOSE 10 GM/15ML PO SOLN
10.0000 g | Freq: Two times a day (BID) | ORAL | Status: DC
  Administered 2015-09-24 – 2015-09-28 (×8): 10 g via ORAL
  Filled 2015-09-24 (×8): qty 15

## 2015-09-24 MED ORDER — ENSURE ENLIVE PO LIQD
237.0000 mL | Freq: Two times a day (BID) | ORAL | Status: DC
  Administered 2015-09-24: 237 mL via ORAL

## 2015-09-24 NOTE — Evaluation (Signed)
Physical Therapy Evaluation Patient Details Name: Chad MallickKyle Orozco MRN: [id number] DOB: 03/18/1981 Today's Date: 09/24/2015   History of Present Illness  The patient is a 35 y.o. year-old male with history of elevated blood pressures, GERD, tobacco use, and alcohol abuse who presented with alcoholic hepatitis, hematemesis, and alcohol withdrawal on 09/20/15.  Clinical Impression  The patient is pleasant, demonstrates mild balance loss, reports feeling dizziness at times. Ambulated x 300'. Patient will benefit from PT to address problems listed in the note below.    Follow Up Recommendations No PT follow up    Equipment Recommendations  None recommended by PT    Recommendations for Other Services OT consult     Precautions / Restrictions Precautions Precautions: Fall Restrictions Weight Bearing Restrictions: No      Mobility  Bed Mobility Overal bed mobility: Independent                Transfers Overall transfer level: Needs assistance Equipment used: Rolling walker (2 wheeled);None Transfers: Sit to/from Stand Sit to Stand: Supervision            Ambulation/Gait Ambulation/Gait assistance: Supervision;Min guard Ambulation Distance (Feet): 300 Feet Assistive device: Rolling walker (2 wheeled);None       General Gait Details: ambulated with RW and Supervision, without  and min guard. Balance loss x 1 with quick turning.   Stairs            Wheelchair Mobility    Modified Rankin (Stroke Patients Only)       Balance Overall balance assessment: History of Falls;Needs assistance Sitting-balance support: No upper extremity supported;Feet supported Sitting balance-Leahy Scale: Good     Standing balance support: During functional activity;No upper extremity supported Standing balance-Leahy Scale: Fair Standing balance comment: can lean forward  areach toes, turns in a circle with supervision. patient reports feeling dizyy and head is full.                              Pertinent Vitals/Pain Pain Assessment: No/denies pain    Home Living Family/patient expects to be discharged to:: Private residence Living Arrangements: Non-relatives/Friends Available Help at Discharge: Friend(s) Type of Home: House Home Access: Stairs to enter   Secretary/administratorntrance Stairs-Number of Steps: 3-4 Home Layout: One level   Additional Comments: plans for substance abuse  facility    Prior Function                 Hand Dominance        Extremity/Trunk Assessment   Upper Extremity Assessment: Generalized weakness           Lower Extremity Assessment: Generalized weakness      Cervical / Trunk Assessment: Normal  Communication      Cognition Arousal/Alertness: Awake/alert Behavior During Therapy: WFL for tasks assessed/performed Overall Cognitive Status: Within Functional Limits for tasks assessed                      General Comments      Exercises        Assessment/Plan    PT Assessment Patient needs continued PT services  PT Diagnosis Generalized weakness;Difficulty walking   PT Problem List Decreased activity tolerance;Decreased strength;Decreased balance;Decreased mobility;Decreased safety awareness  PT Treatment Interventions Gait training;Functional mobility training;Balance training   PT Goals (Current goals can be found in the Care Plan section) Acute Rehab PT Goals Patient Stated Goal: to walk more, get stronger PT Goal Formulation:  With patient Time For Goal Achievement: 10/08/15 Potential to Achieve Goals: Good    Frequency Min 3X/week   Barriers to discharge        Co-evaluation               End of Session Equipment Utilized During Treatment: Gait belt Activity Tolerance: Patient tolerated treatment well Patient left: in chair;with call bell/phone within reach;with chair alarm set Nurse Communication: Mobility status         Time: 1610-9604 PT Time Calculation (min)  (ACUTE ONLY): 24 min   Charges:   PT Evaluation $PT Eval Low Complexity: 1 Procedure PT Treatments $Gait Training: 8-22 mins   PT G Codes:        Rada Hay 09/24/2015, 10:48 AM Blanchard Kelch PT [phone]

## 2015-09-24 NOTE — Progress Notes (Signed)
[facility] Progress Note   Since last GI note: EGD yesterday, + for portal gastropathy.  No varices.  He was started on 4 week prednisolone course (  daily, for acute alc hepatitis).  He ate small amount last night, is eager for BF sandwich this morning.  Has not been ambulating, feels he is pretty weak and will need some help.  Objective: Vital signs in last 24 hours: Temp:  [98.1 F (36.7 C)-99.6 F (37.6 C)] 98.2 F (36.8 C) (03/14 0609) Pulse Rate:  [70-85] 70 (03/14 0609) Resp:  [13-22] 22 (03/14 0609) BP: (94-118)/(45-80) 112/71 mmHg (03/14 0609) SpO2:  [92 %-100 %] 98 % (03/14 0609) Weight:  [206 lb 2.1 oz (93.5 kg)-207 lb (93.895 kg)] 206 lb 2.1 oz (93.5 kg) (03/14 0609) Last BM Date: 09/20/15 General: alert and oriented times 3, no asterixis Heart: regular rate and rythm Abdomen: soft, non-tender, non-distended, normal bowel sounds Ext: no edema  Lab Results:  Recent Labs  09/22/15 0416 09/23/15 0501 09/24/15 0505  WBC 15.2* 17.2* 14.6*  HGB 13.6 13.1 13.6  PLT 87* 131* 170  MCV 92.9 97.5 98.1    Recent Labs  09/22/15 0416 09/23/15 0501 09/24/15 0505  NA 138 139 140  K 3.8 4.1 4.3  CL 103 103 105  CO2 GLUCOSE 153* 128* 149*  BUN CREATININE 0.34* 0.54* 0.44*  CALCIUM 8.3* 8.3* 8.5*    Recent Labs  09/22/15 0416 09/23/15 0501 09/24/15 0505  PROT 6.6 6.5 6.3*  ALBUMIN 2.6* 2.3* 2.3*  AST 216* 182* 189*  ALT 49 48 60  ALKPHOS 216* 190* 179*  BILITOT 16.3* 18.2* 19.9*    Recent Labs  09/23/15 0501 09/24/15 0505  INR 1.64* 1.70*   Acute (viral) hepatitis panel was all negative  Medications: Scheduled Meds: . chlorhexidine  15 mL Mouth/Throat BID  . clotrimazole  10 mg Oral 5 X Daily  . folic acid  1 mg Oral Daily  . insulin aspart  0-5 Units Subcutaneous QHS  . insulin aspart  0-9 Units Subcutaneous TID WC  . lactulose  30 g Oral BID  . magnesium chloride  1 tablet Oral BID  . multivitamin with  minerals  1 tablet Oral Daily  . ondansetron  4 mg Intravenous BID  . pantoprazole  40 mg Oral Q0600  . prednisoLONE  40 mg Oral Daily  . propranolol  10 mg Oral TID  . sodium chloride flush  3 mL Intravenous Q12H  . thiamine  100 mg Oral Daily   Continuous Infusions:  PRN Meds:.lactulose, LORazepam    Assessment/Plan: 35 y.o. male with severe acute alcoholic hepatitis  No clear signs of cirrhosis on imaging ([place]) fortunately.  His portal gastropathy may just be from his acute illness.    Alcoholic discriminant function is 54 today.  He knows he is still very sick and his LFTs are a bit worse today.  Hopefully will improve over the next few days.  I favor him staying in hospital until clear trend of improving liver function.  He needs PT or OT assessment so that he can start ambulating safely.  He knows he needs to eat.  Will continue scheduled zofran, 4 week course of prenisolone.    He plans to head back to [place] after discharge, his brother is heading this way to help get him back safely.  He already knows about a rehab facility in [place] that he will be going to.  Rob Bunting  P, MD  09/24/2015, 8:12 AM [facility] Pager [phone]

## 2015-09-24 NOTE — Progress Notes (Signed)
Patient brought down to US for paracentesis.  After evaluation, he was not found to have ascites.  No procedure was therefore performed.  He was sent back to his room.  Chad Orozco E 3:39 PM 09/24/2015

## 2015-09-24 NOTE — Progress Notes (Signed)
CRITICAL VALUE ALERT  Critical value received:  Total Bilirubin: 19.9  Date of notification:  09/24/2015  Time of notification:  0559  Critical value read back:Yes.    Nurse who received alert:  Driscilla MoatsBriana Shatora Weatherbee, RN  MD notified (1st page):  Merdis DelayK. Schorr, NP  Time of first page:  0600  MD notified (2nd page):  Time of second page:  Responding MD:    Time MD responded:    NP notified and will report to day shift RN

## 2015-09-24 NOTE — Progress Notes (Signed)
[facility] PROGRESS NOTE  Chad Orozco:[id number] DOB: 1981/07/07 DOA: 09/20/2015 PCP: No primary care provider on file.  Brief Summary  The patient is a 35 y.o. year-old male with history of elevated blood pressures, GERD, tobacco use, and alcohol abuse who presented with alcoholic hepatitis, hematemesis, and alcohol withdrawal. In the ER, VS notable for tachycardia to the 120s and mildly elevated blood pressures tos the 140s-160s. WBC 15, platelets 121. Sodium 132, potassium 2.7, glucose 230. AST 285 and ALT 62, total bilirubin 11.4, INR 1.71. Alcohol level at 11AM was 354. UA concerning for >1000 glucose and large bilirubin. Limited abdominal US demonstrated no gallstones. He had layering gallbladder sludge with mild thickening of the gallbladder wall and mild ascites. CBd was normal. He had suggestion of fatty liver or chronic hepatitis. No color flow was identified in the portal vein.   3/13:  EGD demonstrates moderate portal hypertensive gastropathy  Assessment/Plan  Likely alcoholic hepatitis, initial Maddrey discriminant score of 31, however, due to rising bilirubin, score is rising. -  Steroids started on 3/13 x 4 weeks -  LFTs increasing and INR stable, bilirubin rising  - Avoid hepatotoxins such as tylenol and statins -  Hepatitis A, B, and C negative >> will need vaccination prior to discharge - HIV NR -  Agree that would like to see LFTs and bilirubin starting to trend down prior to discharge  Increasing abdominal distension and pain, likely from hepatitis, but rule out SBP -  Diagnostic paracentesis -  Repeat UA  Persistent cough with pleuritic chest pain, I suspect this is from his ascites/abdominal distension -  CXR:  No acute disease  Possible mild hepatic encephalopathy, diarrhea now - Ammonia level 79  -  Decrease lactulose  Lack of color flow in portal vein could suggest portal vein thrombosis, however, he is pain free - Consider  additional imaging if he develops RUQ pain  Hematemesis secondary to moderate portal hypertensive gastropathy but no varices detected on EGD.   -  Diet advanced.  Continue PPI  Hypertension, likely hypertension, blood pressure stable - continue BB  Alcohol withdrawal, no significant symptoms of withdrawal -  D/c CIWA  Gingivitis and possible thrush with bleeding tooth and gums, resolving - Peridex mouth wash - Clotrimazole troches -  Needs follow up with dentistry  Hyponatremia, hypokalemia, and hypophosphatemia due to alcohol abuse, resolved -  Continue oral and IV supplementation as needed  Diabetes mellitus type 2, A1c 9.4 (this test may be effected by elevated bilirubin), CBG well controlled.  New diagnosis this admission.   -  Continue low dose SSI -  Diabetic educator and nutrition consultation  -  RN to educate about diabetes also   Palpitations for last few weeks, at risk for alcoholic cardiomyopathy - ECHO:  Mild MR and TR, otherwise wnl.  Preserved EF - Telemetry:  NSR - TSH 1.211  Hyponatremia due to cirrhosis and hepatitis, resolved with IVF  Thrombocytopenia due to cirrhosis and acute illness, improving  Leukocytosis, likely secondary to hepatitis -  Monitor for other symptoms of infection to prompt further evaluation  Diet: diabetic/healthy heart Access: PIV IVF: off Proph: SCDs (given reported hematemesis)  Code Status: full Family Communication: patient and his friend who was at bedside Disposition Plan:  Needs to have signs of improvement in bilirubin, tolerating diet prior to discharge   Consultants:  Gastroenterology, Dr. Leone Payor  Procedures:  Abd [place]  EGD  Antibiotics:  none   HPI/Subjective:  Worsening pain in RUQ, abdomen  distended.  Coughing and having some pleuritic chest pain.  Denies dysuria  Objective: Filed Vitals:   09/23/15 2215 09/24/15 0155 09/24/15 0609 09/24/15 0947  BP: 114/60 118/80 112/71 120/63  Pulse:  75 72 70 76  Temp: 98.1 F (36.7 C) 98.8 F (37.1 C) 98.2 F (36.8 C) 97.9 F (36.6 C)  TempSrc: Oral Oral Oral Oral  Resp: 20 20 22 20   Height:      Weight:   93.5 kg (206 lb 2.1 oz)   SpO2: 94% 96% 98% 95%    Intake/Output Summary (Last 24 hours) at 09/24/15 1335 Last data filed at 09/24/15 0700  Gross per 24 hour  Intake   1535 ml  Output      0 ml  Net   1535 ml   Filed Weights   09/23/15 0605 09/23/15 1052 09/24/15 0609  Weight: 93.9 kg (207 lb 0.2 oz) 93.895 kg (207 lb) 93.5 kg (206 lb 2.1 oz)   Body mass index is 32.28 kg/(m^2).  Exam:   General:  Increasingly jaundiced male, No acute distress  HEENT:  NCAT, mouth clear of blood today  Cardiovascular:  RRR, nl S1, S2 no mrg, 2+ pulses, warm extremities  Respiratory:  Diminished at the bilateral bases, no wheezes, rales, or rhonchi, no increased WOB  Abdomen:   NABS, soft, very distended, more tense than yesterday, tender in the RUQ without rebound or guarding  MSK:   Normal tone and bulk, trace bilateral ankle edema  Neuro:  Grossly moves all extremities  Data Reviewed: Basic Metabolic Panel:  Recent Labs Lab 09/20/15 1056 09/21/15 0337 09/22/15 0416 09/23/15 0501 09/24/15 0505  NA 132* 133* 138 139 140  K 2.7* 3.8 3.8 4.1 4.3  CL 87* 93* 103 103 105  CO2 28 26 25 26 25   GLUCOSE 230* 197* 153* 128* 149*  BUN <5* <5* 7 13 10   CREATININE 0.39* <0.30* 0.34* 0.54* 0.44*  CALCIUM 8.0* 8.1* 8.3* 8.3* 8.5*  MG 1.8 1.6* 2.3 2.2 2.1  PHOS  --  2.0* 2.0* 3.5 3.0   Liver Function Tests:  Recent Labs Lab 09/20/15 1056 09/21/15 0337 09/22/15 0416 09/23/15 0501 09/24/15 0505  AST 285* 274* 216* 182* 189*  ALT 62 65* 49 48 60  ALKPHOS 229* 233* 216* 190* 179*  BILITOT 11.4* 13.3* 16.3* 18.2* 19.9*  PROT 7.4 7.2 6.6 6.5 6.3*  ALBUMIN 3.1* 2.9* 2.6* 2.3* 2.3*    Recent Labs Lab 09/20/15 1056  LIPASE 166*    Recent Labs Lab 09/20/15 1856 09/23/15 0501  AMMONIA 79* 71*   CBC:  Recent  Labs Lab 09/20/15 1001 09/21/15 0337 09/22/15 0416 09/23/15 0501 09/24/15 0505  WBC 15.0* 15.2* 15.2* 17.2* 14.6*  NEUTROABS 12.6*  --   --   --   --   HGB 14.6 13.8 13.6 13.1 13.6  HCT 40.6 39.2 38.0* 38.5* 40.6  MCV 91.0 93.3 92.9 97.5 98.1  PLT 121* 94* 87* 131* 170    Recent Results (from the past 240 hour(s))  MRSA PCR Screening     Status: None   Collection Time: 09/20/15  6:22 PM  Result Value Ref Range Status   MRSA by PCR NEGATIVE NEGATIVE Final    Comment:        The GeneXpert MRSA Assay (FDA approved for NASAL specimens only), is one component of a comprehensive MRSA colonization surveillance program. It is not intended to diagnose MRSA infection nor to guide or monitor treatment for MRSA infections.  Studies: Dg Chest 1 View  09/24/2015  CLINICAL DATA:  Chronic shortness of breath, patient smokes EXAM: CHEST 1 VIEW COMPARISON:  09/12/2015 FINDINGS: The heart size and mediastinal contours are within normal limits. Both lungs are clear. The visualized skeletal structures are unremarkable. IMPRESSION: No active disease. Electronically Signed   By: Esperanza Heir M.D.   On: 09/24/2015 12:29    Scheduled Meds: . chlorhexidine  15 mL Mouth/Throat BID  . clotrimazole  10 mg Oral 5 X Daily  . feeding supplement (ENSURE ENLIVE)  237 mL Oral BID BM  . folic acid  1 mg Oral Daily  . insulin aspart  0-5 Units Subcutaneous QHS  . insulin aspart  0-9 Units Subcutaneous TID WC  . lactulose  10 g Oral BID  . magnesium chloride  1 tablet Oral BID  . multivitamin with minerals  1 tablet Oral Daily  . ondansetron  4 mg Intravenous BID  . pantoprazole  40 mg Oral Q0600  . prednisoLONE  40 mg Oral Daily  . propranolol  10 mg Oral TID  . sodium chloride flush  3 mL Intravenous Q12H  . thiamine  100 mg Oral Daily   Continuous Infusions:    Principal Problem:   Alcoholic hepatitis Active Problems:   Hepatitis   Alcohol withdrawal (HCC)   Hyponatremia    Hypomagnesemia   Hypokalemia   Alcoholic hepatitis with ascites   Hematemesis without nausea   Diabetes mellitus type II, uncontrolled (HCC)   Hyperglycemia    Time spent: 30 min    Hydee Fleece  [facility] Pager [phone]. If 7PM-7AM, please contact night-coverage at [email or web address], password [facility] 09/24/2015, 1:35 PM  LOS: 4 days

## 2015-09-25 ENCOUNTER — Encounter (HOSPITAL_COMMUNITY): Payer: Self-pay | Admitting: Gastroenterology

## 2015-09-25 DIAGNOSIS — R1084 Generalized abdominal pain: Secondary | ICD-10-CM

## 2015-09-25 DIAGNOSIS — F10230 Alcohol dependence with withdrawal, uncomplicated: Secondary | ICD-10-CM

## 2015-09-25 DIAGNOSIS — E1165 Type 2 diabetes mellitus with hyperglycemia: Secondary | ICD-10-CM

## 2015-09-25 DIAGNOSIS — K7011 Alcoholic hepatitis with ascites: Principal | ICD-10-CM

## 2015-09-25 DIAGNOSIS — R109 Unspecified abdominal pain: Secondary | ICD-10-CM | POA: Insufficient documentation

## 2015-09-25 LAB — COMPREHENSIVE METABOLIC PANEL WITH GFR
ALT: 72 U/L — ABNORMAL HIGH (ref 17–63)
AST: 201 U/L — ABNORMAL HIGH (ref 15–41)
Albumin: 2.3 g/dL — ABNORMAL LOW (ref 3.5–5.0)
Alkaline Phosphatase: 168 U/L — ABNORMAL HIGH (ref 38–126)
Anion gap: 8 (ref 5–15)
BUN: 10 mg/dL (ref 6–20)
CO2: 25 mmol/L (ref 22–32)
Calcium: 8.4 mg/dL — ABNORMAL LOW (ref 8.9–10.3)
Chloride: 106 mmol/L (ref 101–111)
Creatinine, Ser: 0.49 mg/dL — ABNORMAL LOW (ref 0.61–1.24)
GFR calc Af Amer: >60 mL/min
GFR calc non Af Amer: >60 mL/min
Glucose, Bld: 146 mg/dL — ABNORMAL HIGH (ref 65–99)
Potassium: 3.7 mmol/L (ref 3.5–5.1)
Sodium: 139 mmol/L (ref 135–145)
Total Bilirubin: 19.7 mg/dL (ref 0.3–1.2)
Total Protein: 6 g/dL — ABNORMAL LOW (ref 6.5–8.1)

## 2015-09-25 LAB — CBC
HCT: 38.4 % — ABNORMAL LOW (ref 39.0–52.0)
Hemoglobin: 13.2 g/dL (ref 13.0–17.0)
MCH: 32.8 pg (ref 26.0–34.0)
MCHC: 34.4 g/dL (ref 30.0–36.0)
MCV: 95.3 fL (ref 78.0–100.0)
Platelets: 207 K/uL (ref 150–400)
RBC: 4.03 MIL/uL — ABNORMAL LOW (ref 4.22–5.81)
RDW: 17.3 % — ABNORMAL HIGH (ref 11.5–15.5)
WBC: 18.2 K/uL — ABNORMAL HIGH (ref 4.0–10.5)

## 2015-09-25 LAB — PROTIME-INR
INR: 1.68 — AB (ref 0.00–1.49)
Prothrombin Time: 19.8 seconds — ABNORMAL HIGH (ref 11.6–15.2)

## 2015-09-25 LAB — GLUCOSE, CAPILLARY
GLUCOSE-CAPILLARY: 241 mg/dL — AB (ref 65–99)
Glucose-Capillary: 108 mg/dL — ABNORMAL HIGH (ref 65–99)
Glucose-Capillary: 159 mg/dL — ABNORMAL HIGH (ref 65–99)
Glucose-Capillary: 143 mg/dL — ABNORMAL HIGH (ref 65–99)

## 2015-09-25 LAB — PHOSPHORUS: Phosphorus: 3.3 mg/dL (ref 2.5–4.6)

## 2015-09-25 LAB — MAGNESIUM: Magnesium: 2.2 mg/dL (ref 1.7–2.4)

## 2015-09-25 NOTE — Progress Notes (Addendum)
[facility] PROGRESS NOTE  Chad Orozco ZOX:[id number] DOB: [birth year] DOA: 09/20/2015 PCP: No primary care provider on file.  Brief Summary  The patient is a 35 y.o. year-old male with history of elevated blood pressures, GERD, tobacco use, and alcohol abuse who presented with alcoholic hepatitis, hematemesis, and alcohol withdrawal. In the ER, VS notable for tachycardia to the 120s and mildly elevated blood pressures tos the 140s-160s. WBC 15, platelets 121. Sodium 132, potassium 2.7, glucose 230. AST 285 and ALT 62, total bilirubin 11.4, INR 1.71. Alcohol level at 11AM was 354. UA concerning for >1000 glucose and large bilirubin. Limited abdominal US demonstrated no gallstones. He had layering gallbladder sludge with mild thickening of the gallbladder wall and mild ascites. CBd was normal. He had suggestion of fatty liver or chronic hepatitis. No color flow was identified in the portal vein.   3/13:  EGD demonstrates moderate portal hypertensive gastropathy  Assessment/Plan  Likely alcoholic hepatitis, initial Maddrey discriminant score of 31, however, due to rising bilirubin, score is rising. -  Steroids started on 3/13 x 4 weeks -  LFTs increasing and INR stable, bilirubin stable today - Avoid hepatotoxins such as tylenol and statins -  Hepatitis A, B, and C negative >> will need vaccination as an outpatient when clinically improved, currently we'll hold on vaccination given he is on steroids, and with acute liver disease as discussed with GI. - HIV NR -  Agree that would like to see LFTs and bilirubin starting to trend down prior to discharge  Increasing abdominal distension and pain, -  likely from hepatitis. - No visible ascites fluid on ultrasound for paracentesis attempted on 3/14 -  Negative urinalysis  Persistent cough with pleuritic chest pain, -  I suspect this is from his ascites/abdominal distension -  CXR:  No acute disease  Possible mild hepatic  encephalopathy,  - diarrhea now - Ammonia level 79  -  Decrease lactulose  Lack of color flow in portal vein could suggest portal vein thrombosis, however, he is pain free - Consider additional imaging if he develops RUQ pain  Hematemesis -  secondary to moderate portal hypertensive gastropathy but no varices detected on EGD.   -  Diet advanced.  Continue PPI  Hypertension,  - likely hypertension, blood pressure stable - continue BB  Alcohol withdrawal, -  no significant symptoms of withdrawal -  D/c CIWA  Gingivitis and possible thrush with bleeding tooth and gums, resolving - Peridex mouth wash - Clotrimazole troches -  Needs follow up with dentistry  Hyponatremia, hypokalemia, and hypophosphatemia  - due to alcohol abuse, resolved - Continue oral and IV supplementation as needed  Diabetes mellitus type 2,  - A1c 9.4 (this test may be effected by elevated bilirubin), CBG well controlled.  New diagnosis this admission.   -  Continue low dose SSI -  Diabetic educator and nutrition consultation  -  RN to educate about diabetes also   Palpitations  - for last few weeks, at risk for alcoholic cardiomyopathy - ECHO:  Mild MR and TR, otherwise wnl.  Preserved EF - Telemetry:  NSR - TSH 1.211  Hyponatremia  - due to cirrhosis and hepatitis, resolved with IVF  Thrombocytopenia  - due to cirrhosis and acute illness, improving  Leukocytosis,  - likely secondary to hepatitis and steroids. -  Afebrile  Diet: diabetic/healthy heart Access: PIV IVF: off Proph: SCDs (given reported hematemesis)  Code Status: full Family Communication: Discussed with patient. Disposition Plan:  Needs to have  signs of improvement in bilirubin, tolerating diet prior to discharge   Consultants:  Gastroenterology, Dr. Leone PayorGessner  Procedures:  Abd US  EGD  Antibiotics:  none   HPI/Subjective:  Denies nausea or vomiting, reports 1 loose bowel movement in a.m.  .  Objective: Filed Vitals:   09/24/15 1607 09/24/15 1736 09/24/15 2139 09/25/15 0656  BP: 112/66 105/65 116/71 111/59  Pulse: 76 79 74 74  Temp: 98.1 F (36.7 C)  98 F (36.7 C) 98 F (36.7 C)  TempSrc: Oral  Oral Oral  Resp: 20  19 20   Height:      Weight:      SpO2: 94%  96% 94%    Intake/Output Summary (Last 24 hours) at 09/25/15 1212 Last data filed at 09/25/15 0700  Gross per 24 hour  Intake    720 ml  Output    150 ml  Net    570 ml   Filed Weights   09/23/15 0605 09/23/15 1052 09/24/15 0609  Weight: 93.9 kg (207 lb 0.2 oz) 93.895 kg (207 lb) 93.5 kg (206 lb 2.1 oz)   Body mass index is 32.28 kg/(m^2).  Exam:   General:  Increasingly jaundiced male, No acute distress  HEENT:  NCAT, mouth clear of blood today  Cardiovascular:  RRR, nl S1, S2 no mrg, 2+ pulses, warm extremities  Respiratory:  Diminished at the bilateral bases, no wheezes, rales, or rhonchi, no increased WOB  Abdomen:   NABS, soft, very distended, more tense than yesterday, tender in the RUQ without rebound or guarding  MSK:   Normal tone and bulk, trace bilateral ankle edema  Neuro:  Grossly moves all extremities  Data Reviewed: Basic Metabolic Panel:  Recent Labs Lab 09/21/15 0337 09/22/15 0416 09/23/15 0501 09/24/15 0505 09/25/15 0534  NA 133* 138 139 140 139  K 3.8 3.8 4.1 4.3 3.7  CL 93* 103 103 105 106  CO2 26 25 26 25 25   GLUCOSE 197* 153* 128* 149* 146*  BUN <5* 7 13 10 10   CREATININE <0.30* 0.34* 0.54* 0.44* 0.49*  CALCIUM 8.1* 8.3* 8.3* 8.5* 8.4*  MG 1.6* 2.3 2.2 2.1 2.2  PHOS 2.0* 2.0* 3.5 3.0 3.3   Liver Function Tests:  Recent Labs Lab 09/21/15 0337 09/22/15 0416 09/23/15 0501 09/24/15 0505 09/25/15 0534  AST 274* 216* 182* 189* 201*  ALT 65* 49 48 60 72*  ALKPHOS 233* 216* 190* 179* 168*  BILITOT 13.3* 16.3* 18.2* 19.9* 19.7*  PROT 7.2 6.6 6.5 6.3* 6.0*  ALBUMIN 2.9* 2.6* 2.3* 2.3* 2.3*    Recent Labs Lab 09/20/15 1056  LIPASE 166*    Recent  Labs Lab 09/20/15 1856 09/23/15 0501  AMMONIA 79* 71*   CBC:  Recent Labs Lab 09/20/15 1001 09/21/15 0337 09/22/15 0416 09/23/15 0501 09/24/15 0505 09/25/15 0534  WBC 15.0* 15.2* 15.2* 17.2* 14.6* 18.2*  NEUTROABS 12.6*  --   --   --   --   --   HGB 14.6 13.8 13.6 13.1 13.6 13.2  HCT 40.6 39.2 38.0* 38.5* 40.6 38.4*  MCV 91.0 93.3 92.9 97.5 98.1 95.3  PLT 121* 94* 87* 131* 170 207    Recent Results (from the past 240 hour(s))  MRSA PCR Screening     Status: None   Collection Time: 09/20/15  6:22 PM  Result Value Ref Range Status   MRSA by PCR NEGATIVE NEGATIVE Final    Comment:        The GeneXpert MRSA Assay (FDA approved for  NASAL specimens only), is one component of a comprehensive MRSA colonization surveillance program. It is not intended to diagnose MRSA infection nor to guide or monitor treatment for MRSA infections.      Studies: Dg Chest 1 View  09/24/2015  CLINICAL DATA:  Chronic shortness of breath, patient smokes EXAM: CHEST 1 VIEW COMPARISON:  09/12/2015 FINDINGS: The heart size and mediastinal contours are within normal limits. Both lungs are clear. The visualized skeletal structures are unremarkable. IMPRESSION: No active disease. Electronically Signed   By: Esperanza Heir M.D.   On: 09/24/2015 12:29   US Abdomen Limited  09/24/2015  CLINICAL DATA:  Abdomen pain, ascites. EXAM: LIMITED ABDOMEN ULTRASOUND FOR ASCITES TECHNIQUE: Limited ultrasound survey for ascites was performed in all four abdominal quadrants. COMPARISON:  September 20, 2015 FINDINGS: No ascites is identified in the abdomen. IMPRESSION: No ascites noted. Electronically Signed   By: Sherian Rein M.D.   On: 09/24/2015 15:57    Scheduled Meds: . chlorhexidine  15 mL Mouth/Throat BID  . clotrimazole  10 mg Oral 5 X Daily  . feeding supplement (GLUCERNA SHAKE)  237 mL Oral TID BM  . folic acid  1 mg Oral Daily  . insulin aspart  0-5 Units Subcutaneous QHS  . insulin aspart  0-9 Units  Subcutaneous TID WC  . lactulose  10 g Oral BID  . magnesium chloride  1 tablet Oral BID  . multivitamin with minerals  1 tablet Oral Daily  . ondansetron  4 mg Intravenous BID  . pantoprazole  40 mg Oral Q0600  . prednisoLONE  40 mg Oral Daily  . propranolol  10 mg Oral TID  . sodium chloride flush  3 mL Intravenous Q12H  . thiamine  100 mg Oral Daily   Continuous Infusions:    Principal Problem:   Alcoholic hepatitis Active Problems:   Hepatitis   Alcohol withdrawal (HCC)   Hyponatremia   Hypomagnesemia   Hypokalemia   Alcoholic hepatitis with ascites   Hematemesis without nausea   Diabetes mellitus type II, uncontrolled (HCC)   Hyperglycemia   Pleuritic chest pain    Time spent: 25 min    [facility], Madelyn Tlatelpa  [facility] Pager [phone]. If 7PM-7AM, please contact night-coverage at [email or web address], password [facility] 09/25/2015, 12:12 PM  LOS: 5 days

## 2015-09-25 NOTE — Progress Notes (Signed)
Physical Therapy Treatment Patient Details Name: Milus MallickKyle See MRN: [id number] DOB: 10/15/1980 Today's Date: 09/25/2015    History of Present Illness The patient is a 35 y.o. year-old male with history of elevated blood pressures, GERD, tobacco use, and alcohol abuse who presented with alcoholic hepatitis, hematemesis, and alcohol withdrawal on 09/20/15.    PT Comments    Patient is improving. Reports intermittent dizziness with turning, not so much in bed, he reports.   Follow Up Recommendations  No PT follow up     Equipment Recommendations  None recommended by PT    Recommendations for Other Services       Precautions / Restrictions Precautions Precautions: Fall    Mobility  Bed Mobility Overal bed mobility: Independent                Transfers Overall transfer level: Independent                  Ambulation/Gait Ambulation/Gait assistance: Supervision Ambulation Distance (Feet): 800 Feet Assistive device: None Gait Pattern/deviations: Step-through pattern;Drifts right/left     General Gait Details: drifts more during turns arounf=d corners and 180*, reports that  he feels off balance with complete turns but not every instance.   Stairs            Wheelchair Mobility    Modified Rankin (Stroke Patients Only)       Balance           Standing balance support: During functional activity;No upper extremity supported Standing balance-Leahy Scale: Fair                      Cognition Arousal/Alertness: Awake/alert                          Exercises      General Comments        Pertinent Vitals/Pain Pain Assessment: No/denies pain    Home Living                      Prior Function            PT Goals (current goals can now be found in the care plan section) Progress towards PT goals: Progressing toward goals    Frequency       PT Plan Current plan remains appropriate    Co-evaluation              End of Session   Activity Tolerance: Patient tolerated treatment well Patient left: in chair;with call bell/phone within reach (informed CNA that chair alarm not activated  as bed alarm was not. Patient stated  he would call.)     Time: 1447-1502 PT Time Calculation (min) (ACUTE ONLY): 15 min  Charges:  $Gait Training: 8-22 mins                    G Codes:      Rada HayHill, Ekansh Sherk Elizabeth 09/25/2015, 3:45 PM

## 2015-09-25 NOTE — Progress Notes (Signed)
[facility] Progress Note    Since last GI note: Ambulated with PT assist, eating better.    Objective: Vital signs in last 24 hours: Temp:  [97.9 F (36.6 C)-98.1 F (36.7 C)] 98 F (36.7 C) (03/15 0656) Pulse Rate:  [74-79] 74 (03/15 0656) Resp:  [19-20] 20 (03/15 0656) BP: (105-120)/(59-71) 111/59 mmHg (03/15 0656) SpO2:  [94 %-96 %] 94 % (03/15 0656) Last BM Date: 09/25/15 General: alert and oriented times 3 Heart: regular rate and rythm Abdomen: soft, non-tender, non-distended, normal bowel sounds No signs of encephalopathy  Lab Results:  Recent Labs  09/23/15 0501 09/24/15 0505 09/25/15 0534  WBC 17.2* 14.6* 18.2*  HGB 13.1 13.6 13.2  PLT 131* 170 207  MCV 97.5 98.1 95.3    Recent Labs  09/23/15 0501 09/24/15 0505 09/25/15 0534  NA 139 140 139  K 4.1 4.3 3.7  CL 103 105 106  CO2 26 25 25   GLUCOSE 128* 149* 146*  BUN 13 10 10   CREATININE 0.54* 0.44* 0.49*  CALCIUM 8.3* 8.5* 8.4*    Recent Labs  09/23/15 0501 09/24/15 0505 09/25/15 0534  PROT 6.5 6.3* 6.0*  ALBUMIN 2.3* 2.3* 2.3*  AST 182* 189* 201*  ALT 48 60 72*  ALKPHOS 190* 179* 168*  BILITOT 18.2* 19.9* 19.7*    Recent Labs  09/24/15 0505 09/25/15 0534  INR 1.70* 1.68*     Studies/Results: Dg Chest 1 View  09/24/2015  CLINICAL DATA:  Chronic shortness of breath, patient smokes EXAM: CHEST 1 VIEW COMPARISON:  09/12/2015 FINDINGS: The heart size and mediastinal contours are within normal limits. Both lungs are clear. The visualized skeletal structures are unremarkable. IMPRESSION: No active disease. Electronically Signed   By: Esperanza Heiraymond  Rubner M.D.   On: 09/24/2015 12:29   [place] Abdomen Limited  09/24/2015  CLINICAL DATA:  Abdomen pain, ascites. EXAM: LIMITED ABDOMEN ULTRASOUND FOR ASCITES TECHNIQUE: Limited ultrasound survey for ascites was performed in all four abdominal quadrants. COMPARISON:  September 20, 2015 FINDINGS: No ascites is identified in the abdomen. IMPRESSION:  No ascites noted. Electronically Signed   By: Sherian ReinWei-Chen  Lin M.D.   On: 09/24/2015 15:57     Medications: Scheduled Meds: . chlorhexidine  15 mL Mouth/Throat BID  . clotrimazole  10 mg Oral 5 X Daily  . feeding supplement (GLUCERNA SHAKE)  237 mL Oral TID BM  . folic acid  1 mg Oral Daily  . insulin aspart  0-5 Units Subcutaneous QHS  . insulin aspart  0-9 Units Subcutaneous TID WC  . lactulose  10 g Oral BID  . magnesium chloride  1 tablet Oral BID  . multivitamin with minerals  1 tablet Oral Daily  . ondansetron  4 mg Intravenous BID  . pantoprazole  40 mg Oral Q0600  . prednisoLONE  40 mg Oral Daily  . propranolol  10 mg Oral TID  . sodium chloride flush  3 mL Intravenous Q12H  . thiamine  100 mg Oral Daily   Continuous Infusions:  PRN Meds:.lactulose    Assessment/Plan: 35 y.o. male severe alcoholic hepatitis  TBili and INR both very slightly improved today.  Perhaps the LFTs have begun to improve.  Needs to continue ambulating and eating. Continue prednisolone for full 4 week course.  If T bili and INR continue to improve it will probably be safe for him to d/c tomorrow.  Rachael FeeJacobs, Sevyn Paredez P, MD  09/25/2015, 9:32 AM  Gastroenterology Pager [phone]

## 2015-09-26 DIAGNOSIS — R109 Unspecified abdominal pain: Secondary | ICD-10-CM

## 2015-09-26 DIAGNOSIS — R739 Hyperglycemia, unspecified: Secondary | ICD-10-CM

## 2015-09-26 LAB — COMPREHENSIVE METABOLIC PANEL
ALBUMIN: 2.5 g/dL — AB (ref 3.5–5.0)
ALK PHOS: 173 U/L — AB (ref 38–126)
ALT: 92 U/L — ABNORMAL HIGH (ref 17–63)
ANION GAP: 9 (ref 5–15)
AST: 215 U/L — ABNORMAL HIGH (ref 15–41)
BILIRUBIN TOTAL: 21.6 mg/dL — AB (ref 0.3–1.2)
BUN: 9 mg/dL (ref 6–20)
CALCIUM: 8.2 mg/dL — AB (ref 8.9–10.3)
CO2: 27 mmol/L (ref 22–32)
Chloride: 101 mmol/L (ref 101–111)
Creatinine, Ser: 0.4 mg/dL — ABNORMAL LOW (ref 0.61–1.24)
GFR calc non Af Amer: >60 mL/min (ref >60)
GLUCOSE: 109 mg/dL — AB (ref 65–99)
POTASSIUM: 3.6 mmol/L (ref 3.5–5.1)
SODIUM: 137 mmol/L (ref 135–145)
TOTAL PROTEIN: 6.2 g/dL — AB (ref 6.5–8.1)

## 2015-09-26 LAB — CBC
HCT: 39.7 % (ref 39.0–52.0)
HEMOGLOBIN: 13.6 g/dL (ref 13.0–17.0)
MCH: 33.7 pg (ref 26.0–34.0)
MCHC: 34.3 g/dL (ref 30.0–36.0)
MCV: 98.5 fL (ref 78.0–100.0)
PLATELETS: 245 10*3/uL (ref 150–400)
RBC: 4.03 MIL/uL — AB (ref 4.22–5.81)
RDW: 18.7 % — ABNORMAL HIGH (ref 11.5–15.5)
WBC: 18.1 10*3/uL — AB (ref 4.0–10.5)

## 2015-09-26 LAB — MAGNESIUM: Magnesium: 2.2 mg/dL (ref 1.7–2.4)

## 2015-09-26 LAB — PROTIME-INR
INR: 1.63 — AB (ref 0.00–1.49)
PROTHROMBIN TIME: 19.3 s — AB (ref 11.6–15.2)

## 2015-09-26 LAB — GLUCOSE, CAPILLARY
GLUCOSE-CAPILLARY: 130 mg/dL — AB (ref 65–99)
GLUCOSE-CAPILLARY: 159 mg/dL — AB (ref 65–99)
Glucose-Capillary: 134 mg/dL — ABNORMAL HIGH (ref 65–99)
Glucose-Capillary: 212 mg/dL — ABNORMAL HIGH (ref 65–99)

## 2015-09-26 LAB — PHOSPHORUS: PHOSPHORUS: 4 mg/dL (ref 2.5–4.6)

## 2015-09-26 LAB — AMMONIA: AMMONIA: 45 umol/L — AB (ref 9–35)

## 2015-09-26 MED ORDER — FUROSEMIDE 20 MG PO TABS
20.0000 mg | ORAL_TABLET | Freq: Every day | ORAL | Status: DC
  Administered 2015-09-26 – 2015-09-28 (×3): 20 mg via ORAL
  Filled 2015-09-26 (×3): qty 1

## 2015-09-26 NOTE — Care Management Note (Signed)
Case Management Note  Patient Details  Name: Chad MallickKyle Orozco MRN: [id number] Date of Birth: 02/21/1981  Subjective/Objective: elevated liver enzymes-lactulose. From home. Per nsg [place]-will need otpt etoh resources-patient can get those services once in [place].                  Action/Plan:d/c plan home.   Expected Discharge Date:   (unknown)               Expected Discharge Plan:  Home/Self Care  In-House Referral:     Discharge planning Services  CM Consult  Post Acute Care Choice:    Choice offered to:     DME Arranged:    DME Agency:     HH Arranged:    HH Agency:     Status of Service:  In process, will continue to follow  Medicare Important Message Given:    Date Medicare IM Given:    Medicare IM give by:    Date Additional Medicare IM Given:    Additional Medicare Important Message give by:     If discussed at Long Length of Stay Meetings, dates discussed:    Additional Comments:  Lanier ClamMahabir, Chad Schumm, RN 09/26/2015, 1:44 PM

## 2015-09-26 NOTE — Progress Notes (Signed)
CRITICAL VALUE ALERT  Critical value received:  Total Bilirubin 21.6  Date of notification:  09/26/2015  Time of notification:  0643  Critical value read back:Yes.    Nurse who received alert:  J.Nelwyn Hebdon  MD notified (1st page):  K.Schorr  Time of first page:  [phone]  MD notified (2nd page):  Time of second page:  Responding MD:  N/A  Time MD responded:  N/A

## 2015-09-26 NOTE — Progress Notes (Signed)
[facility] PROGRESS NOTE  Chad Orozco ZOX:[id number] DOB: Mar 17, 1981 DOA: 09/20/2015 PCP: No primary care provider on file.  Brief Summary  The patient is a 35 y.o. year-old male with history of elevated blood pressures, GERD, tobacco use, and alcohol abuse who presented with alcoholic hepatitis, hematemesis, and alcohol withdrawal. In the ER, VS notable for tachycardia to the 120s and mildly elevated blood pressures tos the 140s-160s. WBC 15, platelets 121. Sodium 132, potassium 2.7, glucose 230. AST 285 and ALT 62, total bilirubin 11.4, INR 1.71. Alcohol level at 11AM was 354. UA concerning for >1000 glucose and large bilirubin. Limited abdominal US demonstrated no gallstones. He had layering gallbladder sludge with mild thickening of the gallbladder wall and mild ascites. CBd was normal. He had suggestion of fatty liver or chronic hepatitis. No color flow was identified in the portal vein.   3/13:  EGD demonstrates moderate portal hypertensive gastropathy  Assessment/Plan  Likely alcoholic hepatitis, initial Maddrey discriminant score of 31, however, due to rising bilirubin, score is rising. -  Steroids started on 3/13 x 4 weeks - Avoid hepatotoxins such as tylenol and statins -  Hepatitis A, B, and C negative >> will need vaccination as an outpatient when clinically improved, currently we'll hold on vaccination given he is on steroids, and with acute liver disease as discussed with GI. - HIV NR -  Agree that would like to see LFTs and bilirubin starting to trend down prior to discharge, unfortunately patient still having LFTs going up LFT is 21.6 today up from 19.6 yesterday  Increasing abdominal distension and pain, -  likely from hepatitis. - No visible ascites fluid on ultrasound for paracentesis attempted on 3/14 -  Negative urinalysis  Persistent cough with pleuritic chest pain, -  I suspect this is from his ascites/abdominal distension -  CXR:  No acute  disease  Possible mild hepatic encephalopathy,  - diarrhea now - Ammonia level trending down, most recent is 45, continue with lactulose.  Lack of color flow in portal vein could suggest portal vein thrombosis, however, he is pain free - Consider additional imaging if he develops RUQ pain  Hematemesis -  secondary to moderate portal hypertensive gastropathy but no varices detected on EGD.   -  Diet advanced.  Continue PPI  Hypertension,  - likely hypertension, blood pressure stable - continue BB  Alcohol withdrawal, -  no significant symptoms of withdrawal -  D/c CIWA  Gingivitis and possible thrush with bleeding tooth and gums, resolving - Peridex mouth wash - Clotrimazole troches -  Needs follow up with dentistry  Hyponatremia, hypokalemia, and hypophosphatemia  - due to alcohol abuse, resolved - Continue oral and IV supplementation as needed  Diabetes mellitus type 2,  - A1c 9.4 (this test may be effected by elevated bilirubin), CBG well controlled.  New diagnosis this admission.   -  Continue low dose SSI -  Diabetic educator and nutrition consultation  -  RN to educate about diabetes also   Palpitations  - for last few weeks, at risk for alcoholic cardiomyopathy - ECHO:  Mild MR and TR, otherwise wnl.  Preserved EF - Telemetry:  NSR - TSH 1.211  Hyponatremia  - due to cirrhosis and hepatitis, resolved with IVF  Thrombocytopenia  - due to cirrhosis and acute illness, improving  Leukocytosis,  - likely secondary to hepatitis and steroids. -  Afebrile  Diet: diabetic/healthy heart Access: PIV IVF: off Proph: SCDs (given reported hematemesis), patient is ambulatory.  Code Status: full  Family Communication: Discussed with patient. Disposition Plan:  Needs to have signs of improvement in bilirubin, tolerating diet prior to discharge   Consultants:  Gastroenterology, Dr. Leone Payor  Procedures:  Abd [place]  EGD  Antibiotics:  none    HPI/Subjective:  Denies nausea or vomiting, reports 1 loose bowel movement in a.m. .  Objective: Filed Vitals:   09/25/15 1638 09/25/15 2118 09/26/15 0521 09/26/15 0845  BP: 114/56 107/59 109/69 125/68  Pulse: 78 71 73 82  Temp: 98.1 F (36.7 C) 98.8 F (37.1 C) 99 F (37.2 C) 98.2 F (36.8 C)  TempSrc: Oral Oral Oral Oral  Resp: Height:      Weight:      SpO2: 97% 97% 97% 98%   No intake or output data in the 24 hours ending 09/26/15 1419 Filed Weights   09/23/15 0605 09/23/15 1052 09/24/15 0609  Weight: 93.9 kg (207 lb 0.2 oz) 93.895 kg (207 lb) 93.5 kg (206 lb 2.1 oz)   Body mass index is 32.28 kg/(m^2).  Exam:   General:  Increasingly jaundiced male, No acute distress  HEENT:  NCAT, mouth clear of blood today  Cardiovascular:  RRR, nl S1, S2 no mrg, 2+ pulses, warm extremities  Respiratory:  Diminished at the bilateral bases, no wheezes, rales, or rhonchi, no increased WOB  Abdomen:   NABS, soft, very distended,But no tenderness.  MSK:   Normal tone and bulk, trace bilateral ankle edema  Neuro:  Grossly moves all extremities  Data Reviewed: Basic Metabolic Panel:  Recent Labs Lab 09/22/15 0416 09/23/15 0501 09/24/15 0505 09/25/15 0534 09/26/15 0526  NA 138 139 140 139 137  K 3.8 4.1 4.3 3.7 3.6  CL 103 103 105 106 101  CO2 GLUCOSE 153* 128* 149* 146* 109*  BUN CREATININE 0.34* 0.54* 0.44* 0.49* 0.40*  CALCIUM 8.3* 8.3* 8.5* 8.4* 8.2*  MG 2.3 2.2 2.1 2.2 2.2  PHOS 2.0* 3.5 3.0 3.3 4.0   Liver Function Tests:  Recent Labs Lab 09/22/15 0416 09/23/15 0501 09/24/15 0505 09/25/15 0534 09/26/15 0526  AST 216* 182* 189* 201* 215*  ALT 49 48 60 72* 92*  ALKPHOS 216* 190* 179* 168* 173*  BILITOT 16.3* 18.2* 19.9* 19.7* 21.6*  PROT 6.6 6.5 6.3* 6.0* 6.2*  ALBUMIN 2.6* 2.3* 2.3* 2.3* 2.5*    Recent Labs Lab 09/20/15 1056  LIPASE 166*    Recent Labs Lab 09/20/15 1856 09/23/15 0501  09/26/15 0526  AMMONIA 79* 71* 45*   CBC:  Recent Labs Lab 09/20/15 1001  09/22/15 0416 09/23/15 0501 09/24/15 0505 09/25/15 0534 09/26/15 0526  WBC 15.0*  < > 15.2* 17.2* 14.6* 18.2* 18.1*  NEUTROABS 12.6*  --   --   --   --   --   --   HGB 14.6  < > 13.6 13.1 13.6 13.2 13.6  HCT 40.6  < > 38.0* 38.5* 40.6 38.4* 39.7  MCV 91.0  < > 92.9 97.5 98.1 95.3 98.5  PLT 121*  < > 87* 131* 170 207 245  < > = values in this interval not displayed.  Recent Results (from the past 240 hour(s))  MRSA PCR Screening     Status: None   Collection Time: 09/20/15  6:22 PM  Result Value Ref Range Status   MRSA by PCR NEGATIVE NEGATIVE Final    Comment:        The GeneXpert MRSA Assay (  FDA approved for NASAL specimens only), is one component of a comprehensive MRSA colonization surveillance program. It is not intended to diagnose MRSA infection nor to guide or monitor treatment for MRSA infections.      Studies: [place] Abdomen Limited  09/24/2015  CLINICAL DATA:  Abdomen pain, ascites. EXAM: LIMITED ABDOMEN ULTRASOUND FOR ASCITES TECHNIQUE: Limited ultrasound survey for ascites was performed in all four abdominal quadrants. COMPARISON:  September 20, 2015 FINDINGS: No ascites is identified in the abdomen. IMPRESSION: No ascites noted. Electronically Signed   By: Sherian ReinWei-Chen  Lin M.D.   On: 09/24/2015 15:57    Scheduled Meds: . chlorhexidine  15 mL Mouth/Throat BID  . clotrimazole  10 mg Oral 5 X Daily  . feeding supplement (GLUCERNA SHAKE)  237 mL Oral TID BM  . folic acid  1 mg Oral Daily  . insulin aspart  0-5 Units Subcutaneous QHS  . insulin aspart  0-9 Units Subcutaneous TID WC  . lactulose  10 g Oral BID  . magnesium chloride  1 tablet Oral BID  . multivitamin with minerals  1 tablet Oral Daily  . ondansetron  4 mg Intravenous BID  . pantoprazole  40 mg Oral Q0600  . prednisoLONE  40 mg Oral Daily  . propranolol  10 mg Oral TID  . sodium chloride flush  3 mL Intravenous Q12H  .  thiamine  100 mg Oral Daily   Continuous Infusions:    Principal Problem:   Alcoholic hepatitis Active Problems:   Hepatitis   Alcohol withdrawal (HCC)   Hyponatremia   Hypomagnesemia   Hypokalemia   Alcoholic hepatitis with ascites   Hematemesis without nausea   Diabetes mellitus type II, uncontrolled (HCC)   Hyperglycemia   Pleuritic chest pain   Abdominal pain    Time spent: 25 min    Valene Villa  [facility] Pager [phone]. If 7PM-7AM, please contact night-coverage at [email or web address], password [email or web address] 09/26/2015, 2:19 PM  LOS: 6 days

## 2015-09-26 NOTE — Progress Notes (Signed)
     [facility] Progress Note  Subjective:  Feeling a little better.  Eating some but not a lot.  Walking around.  LFT's/total bili up some again today.  Denies abdominal pain, just feels tense, but ultrasound showed no fluid available for paracentesis on 3/14.  Objective:  Vital signs in last 24 hours: Temp:  [98.1 F (36.7 C)-99 F (37.2 C)] 98.2 F (36.8 C) (03/16 0845) Pulse Rate:  [71-82] 82 (03/16 0845) Resp:  [18-20] 18 (03/16 0845) BP: (107-125)/(56-69) 125/68 mmHg (03/16 0845) SpO2:  [97 %-98 %] 98 % (03/16 0845) Last BM Date: 09/26/15 General:  Alert, Well-developed, in NAD Heart:  Regular rate and rhythm; no murmurs Pulm:  CTAB.  No W/R/R. Abdomen:  Soft but distended.  BS present.  Non-tender. Extremities:  1+ pitting edema in B/L LE's. Neurologic:  Alert and oriented x 4;  grossly normal neurologically. Psych:  Alert and cooperative. Normal mood and affect.  Lab Results:  Recent Labs  09/24/15 0505 09/25/15 0534 09/26/15 0526  WBC 14.6* 18.2* 18.1*  HGB 13.6 13.2 13.6  HCT 40.6 38.4* 39.7  PLT 170 207 245   BMET  Recent Labs  09/24/15 0505 09/25/15 0534 09/26/15 0526  NA 140 139 137  K 4.3 3.7 3.6  CL 105 106 101  CO2 25 25 27   GLUCOSE 149* 146* 109*  BUN 10 10 9   CREATININE 0.44* 0.49* 0.40*  CALCIUM 8.5* 8.4* 8.2*   LFT  Recent Labs  09/26/15 0526  PROT 6.2*  ALBUMIN 2.5*  AST 215*  ALT 92*  ALKPHOS 173*  BILITOT 21.6*   PT/INR  Recent Labs  09/25/15 0534 09/26/15 0526  LABPROT 19.8* 19.3*  INR 1.68* 1.63*   Dg Chest 1 View  09/24/2015  CLINICAL DATA:  Chronic shortness of breath, patient smokes EXAM: CHEST 1 VIEW COMPARISON:  09/12/2015 FINDINGS: The heart size and mediastinal contours are within normal limits. Both lungs are clear. The visualized skeletal structures are unremarkable. IMPRESSION: No active disease. Electronically Signed   By: Esperanza Heiraymond  Rubner M.D.   On: 09/24/2015 12:29   [place] Abdomen  Limited  09/24/2015  CLINICAL DATA:  Abdomen pain, ascites. EXAM: LIMITED ABDOMEN ULTRASOUND FOR ASCITES TECHNIQUE: Limited ultrasound survey for ascites was performed in all four abdominal quadrants. COMPARISON:  September 20, 2015 FINDINGS: No ascites is identified in the abdomen. IMPRESSION: No ascites noted. Electronically Signed   By: Sherian ReinWei-Chen  Lin M.D.   On: 09/24/2015 15:57   Assessment / Plan: 35 y.o. male severe alcoholic hepatitis  LFT's up slightly more again today.  Continue prednisolone for full 4 week course.  Should wait and make sure that LFT's are trending down consistently before discharging him.  Monitor labs.  Needs to continue to eat and be out of bed, walking around.  [place] at [place].   LOS: 6 days   ZEHR, JESSICA D.  09/26/2015, 10:20 AM  Pager number [phone]   ________________________________________________________________________  [facility] MD note:  I  reviewed the data and agree with the assessment and plan described above.   Rob Buntinganiel Jyllian Haynie, MD [facility] Pager [phone]

## 2015-09-26 NOTE — Progress Notes (Signed)
Physical Therapy Treatment Patient Details Name: Milus MallickKyle Lorson MRN: [id number] DOB: 04/15/1981 Today's Date: 09/26/2015    History of Present Illness The patient is a 35 y.o. year-old male with history of elevated blood pressures, GERD, tobacco use, and alcohol abuse who presented with alcoholic hepatitis, hematemesis, and alcohol withdrawal on 09/20/15.    PT Comments    Pt feeling "better".  Stated "I've been walking around by myself.  Assisted amb a greater distance in hallway with no LOB and no lateral instability as earlier noted.   Will update LPT on pt's mobility progress.   Follow Up Recommendations  No PT follow up     Equipment Recommendations       Recommendations for Other Services       Precautions / Restrictions Precautions Precautions: Fall Restrictions Weight Bearing Restrictions: No    Mobility  Bed Mobility               General bed mobility comments: OOB in recliner  Transfers Overall transfer level: Independent Equipment used: None Transfers: Sit to/from Stand Sit to Stand: Modified independent (Device/Increase time)         General transfer comment: good safety cognition and steady  Ambulation/Gait Ambulation/Gait assistance: Modified independent (Device/Increase time) Ambulation Distance (Feet): 850 Feet Assistive device: None Gait Pattern/deviations: Step-through pattern Gait velocity: WFL   General Gait Details: feels "better"   Amb a greater distance with no AD and no LOB.     Stairs            Wheelchair Mobility    Modified Rankin (Stroke Patients Only)       Balance                                    Cognition Arousal/Alertness: Awake/alert Behavior During Therapy: WFL for tasks assessed/performed Overall Cognitive Status: Within Functional Limits for tasks assessed                      Exercises      General Comments        Pertinent Vitals/Pain Pain Assessment: No/denies pain     Home Living                      Prior Function            PT Goals (current goals can now be found in the care plan section) Progress towards PT goals: Progressing toward goals    Frequency  Min 3X/week    PT Plan Current plan remains appropriate    Co-evaluation             End of Session Equipment Utilized During Treatment: Gait belt Activity Tolerance: Patient tolerated treatment well Patient left: in chair;with call bell/phone within reach     Time: 1405-1420 PT Time Calculation (min) (ACUTE ONLY): 15 min  Charges:  $Gait Training: 8-22 mins                    G Codes:      Felecia ShellingLori Giuseppina Quinones  PTA [facility]  Acute  Rehab Pager      [phone]

## 2015-09-27 LAB — COMPREHENSIVE METABOLIC PANEL
ALBUMIN: 2.3 g/dL — AB (ref 3.5–5.0)
ALK PHOS: 174 U/L — AB (ref 38–126)
ALT: 105 U/L — AB (ref 17–63)
AST: 224 U/L — ABNORMAL HIGH (ref 15–41)
Anion gap: 11 (ref 5–15)
BUN: 10 mg/dL (ref 6–20)
CALCIUM: 8.2 mg/dL — AB (ref 8.9–10.3)
CO2: 24 mmol/L (ref 22–32)
Chloride: 103 mmol/L (ref 101–111)
Creatinine, Ser: 0.32 mg/dL — ABNORMAL LOW (ref 0.61–1.24)
GFR calc Af Amer: >60 mL/min (ref >60)
GFR calc non Af Amer: >60 mL/min (ref >60)
GLUCOSE: 133 mg/dL — AB (ref 65–99)
Potassium: 3.7 mmol/L (ref 3.5–5.1)
SODIUM: 138 mmol/L (ref 135–145)
Total Bilirubin: 21.8 mg/dL (ref 0.3–1.2)
Total Protein: 5.9 g/dL — ABNORMAL LOW (ref 6.5–8.1)

## 2015-09-27 LAB — PROTIME-INR
INR: 1.63 — ABNORMAL HIGH (ref 0.00–1.49)
Prothrombin Time: 19.3 seconds — ABNORMAL HIGH (ref 11.6–15.2)

## 2015-09-27 LAB — GLUCOSE, CAPILLARY
GLUCOSE-CAPILLARY: 142 mg/dL — AB (ref 65–99)
GLUCOSE-CAPILLARY: 114 mg/dL — AB (ref 65–99)
GLUCOSE-CAPILLARY: 160 mg/dL — AB (ref 65–99)
Glucose-Capillary: 263 mg/dL — ABNORMAL HIGH (ref 65–99)

## 2015-09-27 MED ORDER — GLIPIZIDE 2.5 MG HALF TABLET
2.5000 mg | ORAL_TABLET | Freq: Every day | ORAL | Status: DC
  Administered 2015-09-27 – 2015-09-28 (×2): 2.5 mg via ORAL
  Filled 2015-09-27 (×2): qty 1

## 2015-09-27 MED ORDER — HEPARIN SODIUM (PORCINE) 5000 UNIT/ML IJ SOLN
5000.0000 [IU] | Freq: Three times a day (TID) | INTRAMUSCULAR | Status: DC
  Administered 2015-09-27: 5000 [IU] via SUBCUTANEOUS
  Filled 2015-09-27 (×2): qty 1

## 2015-09-27 NOTE — Progress Notes (Addendum)
[facility] Progress Note    Since last GI note: Ate [employer] sandwich last night, ambulating in halls, thinking very clearly.  Objective: Vital signs in last 24 hours: Temp:  [98.2 F (36.8 C)-98.3 F (36.8 C)] 98.3 F (36.8 C) (03/17 0655) Pulse Rate:  [67-82] 73 (03/17 0655) Resp:  [18] 18 (03/17 0655) BP: (107-125)/(58-68) 107/58 mmHg (03/17 0655) SpO2:  [94 %-98 %] 94 % (03/17 0655) Last BM Date: 09/26/15 General: alert and oriented times 3 Heart: regular rate and rythm Abdomen: soft, non-tender, non-distended, normal bowel sounds 1+ edema in ankles bilaterally  Lab Results:  Recent Labs  09/25/15 0534 09/26/15 0526  WBC 18.2* 18.1*  HGB 13.2 13.6  PLT 207 245  MCV 95.3 98.5    Recent Labs  09/25/15 0534 09/26/15 0526 09/27/15 0458  NA 139 137 138  K 3.7 3.6 3.7  CL 106 101 103  CO2 25 27 24   GLUCOSE 146* 109* 133*  BUN 10 9 10   CREATININE 0.49* 0.40* 0.32*  CALCIUM 8.4* 8.2* 8.2*    Recent Labs  09/25/15 0534 09/26/15 0526 09/27/15 0458  PROT 6.0* 6.2* 5.9*  ALBUMIN 2.3* 2.5* 2.3*  AST 201* 215* 224*  ALT 72* 92* 105*  ALKPHOS 168* 173* 174*  BILITOT 19.7* 21.6* 21.8*    Recent Labs  09/26/15 0526 09/27/15 0458  INR 1.63* 1.63*     Medications: Scheduled Meds: . chlorhexidine  15 mL Mouth/Throat BID  . clotrimazole  10 mg Oral 5 X Daily  . feeding supplement (GLUCERNA SHAKE)  237 mL Oral TID BM  . folic acid  1 mg Oral Daily  . furosemide  20 mg Oral Daily  . heparin subcutaneous  5,000 Units Subcutaneous 3 times per day  . insulin aspart  0-5 Units Subcutaneous QHS  . insulin aspart  0-9 Units Subcutaneous TID WC  . lactulose  10 g Oral BID  . magnesium chloride  1 tablet Oral BID  . multivitamin with minerals  1 tablet Oral Daily  . ondansetron  4 mg Intravenous BID  . pantoprazole  40 mg Oral Q0600  . prednisoLONE  40 mg Oral Daily  . propranolol  10 mg Oral TID  . sodium chloride flush  3 mL Intravenous Q12H   . thiamine  100 mg Oral Daily   Continuous Infusions:  PRN Meds:.lactulose    Assessment/Plan: 35 y.o. male with severe alc hep (started on prednisolone this admit for DF in 1150s)  Hepatitis discriminant function is currently 5655.    He has no sign of encephalopathy.  LFTs staying very elevated, not really improving but not significantly worsening. It may be weeks to months before they normalize. Since he is clinically doing well (eating, ambulating, mentating clearly) I think it will be safe for d/c tomorrow as long as no significant events overnight.  His brother is planning to come down from [place] and bring him home, immediately to an in patient type rehab facility.  He should continue a full course of prendisolone 4 weeks (should another 3 1/2 weeks left after d/c).  Please call or page with any further questions or concerns.   Rachael FeeJacobs, Lee Kalt P, MD  09/27/2015, 8:16 AM [facility] Pager [phone]

## 2015-09-27 NOTE — Clinical Social Work Note (Signed)
Clinical Social Work Assessment  Patient Details  Name: Chad Orozco MRN: [id number] Date of Birth: 05/11/1981  Date of referral:  09/27/15               Reason for consult:  Substance Use/ETOH Abuse, Discharge Planning                Permission sought to share information with:  Family Supports Permission granted to share information::  Yes, Verbal Permission Granted  Name::     only contact to be made with family is brother Chad Orozco!  Agency::  [place]: Substance Abuse facility  Relationship::  Only Chad Orozco his brother.  Contact Information:     Housing/Transportation Living arrangements for the past 2 months:  Apartment Source of Information:  Patient, Medical Team Patient Interpreter Needed:  None Criminal Activity/Legal Involvement Pertinent to Current Situation/Hospitalization:  No - Comment as needed Significant Relationships:  Other Family Members, Friend Lives with:  Self Do you feel safe going back to the place where you live?  No (referral for inpatient substance abuse rehab) Need for family participation in patient care:  No ([facility]) (pt declined)  Care giving concerns:  No care giving concerns noted at this time. Patient reports he will be going inpatient for substance abuse rehab facility in [place].   Patient evaluated by PT with no acute needs or DC needs   Office manager / plan:  LCSW received consult to address substance abuse and fax clinicals to [place] a substance abuse facility in which he was referred to by his best friend's mother who is a substance abuse counselor.  Contact has been made with facility and patient has completed his application. Patient plans to be DC on Saturday per report or when medically cleared with hopes of bed being ready for him with no intermittent planning needed to bridge time between hospital and facility. Patient's brother Chad Orozco) will be the family member to take him to facility.  Patient has another brother Chad Orozco) who has called  to hospital, but should not be given any information per patient. Only Chad Orozco at this time. Patient has signed a release for SA documents to be faxed to facility.  LCSW will complete request and follow up with facilty regarding his discharge plan.  No other needs at this time per patient.  He has no outstanding criminal/legal obligation or court orders. He is not currently working at this time, thus no contact with work/employment.    Employment status:  Unemployed Health and safety inspector:  Managed Care PT Recommendations:  No Follow Up Information / Referral to community resources:  Residential Substance Abuse Treatment Options, SBIRT  Patient/Family's Response to care:  Agreeable to plan  Patient/Family's Understanding of and Emotional Response to Diagnosis, Current Treatment, and Prognosis:  Patient presents very motivated to get into treatment facility and has been proactive with application and making contact with facility on his own. He has given permission for LCSW to complete clinical requests by faxing information.  Patient is hopeful for positive gains in treatment while he addresses his sobriety.   Emotional Assessment Appearance:   (due to lack to staff on site, assessment was conducted via phone with patient.  ) Attitude/Demeanor/Rapport:  Other (patient was very cooperative and motivated for treatment) Affect (typically observed):  Accepting, Adaptable, Hopeful, Pleasant Orientation:  Oriented to Self, Oriented to Place, Oriented to  Time, Oriented to Situation Alcohol / Substance use:  Alcohol Use Psych involvement (Current and /or in the community):  Yes (Comment) (TTS  assessment completed prior to admission (no current involvement or needs))  Discharge Needs  Concerns to be addressed:  Care Coordination Readmission within the last 30 days:  No Current discharge risk:  Substance Abuse Barriers to Discharge:  Active Substance Use, Continued Medical Work up   Cordella RegisterCoble, Chad Presley N,  LCSW 09/27/2015, 10:19 AM

## 2015-09-27 NOTE — Progress Notes (Addendum)
All clinical documents faxed for referral purposes for SA rehab facility. Spoke with facility who is awaiting clinical review.    [address] [facility] [address] [place] [id number] [phone] Fax:  [phone]  Will follow up with patient and send additional information as requested. Plan at this time:  Brother will transport at DC Awaiting review of clinicals and will follow up. Patient hopeful for DC on Saturday with bed available to him this weekend.  Will confirm with rehab facility. Deretha EmoryHannah Ivelise Castillo LCSW, MSW

## 2015-09-27 NOTE — Progress Notes (Signed)
[facility] PROGRESS NOTE  Chad Orozco ZOX:[id number] DOB: December 25, 1980 DOA: 09/20/2015 PCP: No primary care provider on file.  Brief Summary  The patient is a 35 y.o. year-old male with history of elevated blood pressures, GERD, tobacco use, and alcohol abuse who presented with alcoholic hepatitis, hematemesis, and alcohol withdrawal. In the ER, VS notable for tachycardia to the 120s and mildly elevated blood pressures tos the 140s-160s. WBC 15, platelets 121. Sodium 132, potassium 2.7, glucose 230. AST 285 and ALT 62, total bilirubin 11.4, INR 1.71. Alcohol level at 11AM was 354. UA concerning for >1000 glucose and large bilirubin. Limited abdominal US demonstrated no gallstones. He had layering gallbladder sludge with mild thickening of the gallbladder wall and mild ascites. CBd was normal. He had suggestion of fatty liver or chronic hepatitis. No color flow was identified in the portal vein.   3/13:  EGD demonstrates moderate portal hypertensive gastropathy  Assessment/Plan  Likely alcoholic hepatitis -  Steroids started on 3/13 x 4 weeks - Avoid hepatotoxins such as tylenol and statins - Hepatitis discriminant function is currently 55.  -  Hepatitis A, B, and C negative >> will need vaccination as an outpatient when clinically improved, currently we'll hold on vaccination given he is on steroids, and with acute liver disease as discussed with GI. - HIV NR -  GI consult greatly appreciated, his LFTs are staying very elevated, significant improvement, but it might take a few weeks to months before they normalize, patient is not encephalopathic, good appetite, remains stable although he can be discharged in a.m..  Increasing abdominal distension and pain, -  likely from hepatitis. - No visible ascites fluid on ultrasound for paracentesis attempted on 3/14 -  Negative urinalysis  Persistent cough with pleuritic chest pain, -  I suspect this is from his ascites/abdominal  distension -  CXR:  No acute disease  Possible mild hepatic encephalopathy,  - diarrhea now - Ammonia level trending down, most recent is 45, continue with lactulose.  Lack of color flow in portal vein could suggest portal vein thrombosis, however, he is pain free - Consider additional imaging if he develops RUQ pain  Hematemesis -  secondary to moderate portal hypertensive gastropathy but no varices detected on EGD.   -  Diet advanced.  Continue PPI  Hypertension,  - likely hypertension, blood pressure stable - continue BB  Alcohol withdrawal, -  no significant symptoms of withdrawal -  D/c CIWA  Gingivitis and possible thrush with bleeding tooth and gums, resolving - Peridex mouth wash - Clotrimazole troches -  Needs follow up with dentistry  Hyponatremia, hypokalemia, and hypophosphatemia  - due to alcohol abuse, resolved - Continue oral and IV supplementation as needed  Diabetes mellitus type 2,  - A1c 9.4 (this test may be effected by elevated bilirubin), CBG well controlled.  New diagnosis this admission.   -  Continue low dose SSI -  Diabetic educator and nutrition consultation  -  RN to educate about diabetes also  - Will start on Glucotrol in anticipation for discharge tomorrow.  Palpitations  - for last few weeks, at risk for alcoholic cardiomyopathy - ECHO:  Mild MR and TR, otherwise wnl.  Preserved EF - Telemetry:  NSR - TSH 1.211  Hyponatremia  - due to cirrhosis and hepatitis, resolved with IVF  Thrombocytopenia  - due to cirrhosis and acute illness, improving  Leukocytosis,  - likely secondary to hepatitis and steroids. -  Afebrile  Diet: diabetic/healthy heart Access: PIV IVF: off  Proph: SCDs (given reported hematemesis), patient is ambulatory.  Code Status: full Family Communication: spoke with brother via phone. Disposition Plan:  Possible D/C in am if continues to improve.   Consultants:  Gastroenterology, Dr.  Leone PayorGessner  Procedures:  Abd US  EGD  Antibiotics:  none   HPI/Subjective:  Denies nausea , vomiting or abd pain, reports few loose bowel movement yesterday.  Objective: Filed Vitals:   09/26/15 0521 09/26/15 0845 09/26/15 2144 09/27/15 0655  BP: 109/69 125/68 114/66 107/58  Pulse: 73 82 67 73  Temp: 99 F (37.2 C) 98.2 F (36.8 C) 98.3 F (36.8 C) 98.3 F (36.8 C)  TempSrc: Oral Oral Oral Oral  Resp: 18 18 18 18   Height:      Weight:      SpO2: 97% 98% 96% 94%    Intake/Output Summary (Last 24 hours) at 09/27/15 1328 Last data filed at 09/27/15 0830  Gross per 24 hour  Intake    480 ml  Output      0 ml  Net    480 ml   Filed Weights   09/23/15 0605 09/23/15 1052 09/24/15 0609  Weight: 93.9 kg (207 lb 0.2 oz) 93.895 kg (207 lb) 93.5 kg (206 lb 2.1 oz)   Body mass index is 32.28 kg/(m^2).  Exam:   General:  Increasingly jaundiced male, No acute distress  HEENT:  NCAT, mouth clear of blood today  Cardiovascular:  RRR, nl S1, S2 no mrg, 2+ pulses, warm extremities  Respiratory:  Diminished at the bilateral bases, no wheezes, rales, or rhonchi, no increased WOB  Abdomen:   NABS, soft, very distended,But no tenderness.  MSK:   Normal tone and bulk, trace bilateral ankle edema  Neuro:  Grossly moves all extremities  Data Reviewed: Basic Metabolic Panel:  Recent Labs Lab 09/22/15 0416 09/23/15 0501 09/24/15 0505 09/25/15 0534 09/26/15 0526 09/27/15 0458  NA 138 139 140 139 137 138  K 3.8 4.1 4.3 3.7 3.6 3.7  CL 103 103 105 106 101 103  CO2 25 26 25 25 27 24   GLUCOSE 153* 128* 149* 146* 109* 133*  BUN 7 13 10 10 9 10   CREATININE 0.34* 0.54* 0.44* 0.49* 0.40* 0.32*  CALCIUM 8.3* 8.3* 8.5* 8.4* 8.2* 8.2*  MG 2.3 2.2 2.1 2.2 2.2  --   PHOS 2.0* 3.5 3.0 3.3 4.0  --    Liver Function Tests:  Recent Labs Lab 09/23/15 0501 09/24/15 0505 09/25/15 0534 09/26/15 0526 09/27/15 0458  AST 182* 189* 201* 215* 224*  ALT 48 60 72* 92* 105*   ALKPHOS 190* 179* 168* 173* 174*  BILITOT 18.2* 19.9* 19.7* 21.6* 21.8*  PROT 6.5 6.3* 6.0* 6.2* 5.9*  ALBUMIN 2.3* 2.3* 2.3* 2.5* 2.3*   No results for input(s): LIPASE, AMYLASE in the last 168 hours.  Recent Labs Lab 09/20/15 1856 09/23/15 0501 09/26/15 0526  AMMONIA 79* 71* 45*   CBC:  Recent Labs Lab 09/22/15 0416 09/23/15 0501 09/24/15 0505 09/25/15 0534 09/26/15 0526  WBC 15.2* 17.2* 14.6* 18.2* 18.1*  HGB 13.6 13.1 13.6 13.2 13.6  HCT 38.0* 38.5* 40.6 38.4* 39.7  MCV 92.9 97.5 98.1 95.3 98.5  PLT 87* 131* 170 207 245    Recent Results (from the past 240 hour(s))  MRSA PCR Screening     Status: None   Collection Time: 09/20/15  6:22 PM  Result Value Ref Range Status   MRSA by PCR NEGATIVE NEGATIVE Final    Comment:  The GeneXpert MRSA Assay (FDA approved for NASAL specimens only), is one component of a comprehensive MRSA colonization surveillance program. It is not intended to diagnose MRSA infection nor to guide or monitor treatment for MRSA infections.      Studies: No results found.  Scheduled Meds: . chlorhexidine  15 mL Mouth/Throat BID  . clotrimazole  10 mg Oral 5 X Daily  . feeding supplement (GLUCERNA SHAKE)  237 mL Oral TID BM  . folic acid  1 mg Oral Daily  . furosemide  20 mg Oral Daily  . insulin aspart  0-5 Units Subcutaneous QHS  . insulin aspart  0-9 Units Subcutaneous TID WC  . lactulose  10 g Oral BID  . magnesium chloride  1 tablet Oral BID  . multivitamin with minerals  1 tablet Oral Daily  . ondansetron  4 mg Intravenous BID  . pantoprazole  40 mg Oral Q0600  . prednisoLONE  40 mg Oral Daily  . propranolol  10 mg Oral TID  . sodium chloride flush  3 mL Intravenous Q12H  . thiamine  100 mg Oral Daily   Continuous Infusions:    Principal Problem:   Alcoholic hepatitis Active Problems:   Hepatitis   Alcohol withdrawal (HCC)   Hyponatremia   Hypomagnesemia   Hypokalemia   Alcoholic hepatitis with ascites    Hematemesis without nausea   Diabetes mellitus type II, uncontrolled (HCC)   Hyperglycemia   Pleuritic chest pain   Abdominal pain    Time spent: 20 min    Nicanor Mendolia  [facility] Pager [phone]. If 7PM-7AM, please contact night-coverage at [email or web address], password [facility] 09/27/2015, 1:28 PM  LOS: 7 days

## 2015-09-28 DIAGNOSIS — K920 Hematemesis: Secondary | ICD-10-CM

## 2015-09-28 LAB — COMPREHENSIVE METABOLIC PANEL
ALBUMIN: 2.2 g/dL — AB (ref 3.5–5.0)
ALT: 115 U/L — ABNORMAL HIGH (ref 17–63)
ANION GAP: 13 (ref 5–15)
AST: 202 U/L — AB (ref 15–41)
Alkaline Phosphatase: 178 U/L — ABNORMAL HIGH (ref 38–126)
BUN: 10 mg/dL (ref 6–20)
CHLORIDE: 100 mmol/L — AB (ref 101–111)
CO2: 27 mmol/L (ref 22–32)
Calcium: 8.3 mg/dL — ABNORMAL LOW (ref 8.9–10.3)
Creatinine, Ser: 0.46 mg/dL — ABNORMAL LOW (ref 0.61–1.24)
GFR calc Af Amer: >60 mL/min (ref >60)
GFR calc non Af Amer: >60 mL/min (ref >60)
GLUCOSE: 97 mg/dL (ref 65–99)
POTASSIUM: 3.5 mmol/L (ref 3.5–5.1)
SODIUM: 140 mmol/L (ref 135–145)
TOTAL PROTEIN: 5.7 g/dL — AB (ref 6.5–8.1)
Total Bilirubin: 20.6 mg/dL (ref 0.3–1.2)

## 2015-09-28 LAB — GLUCOSE, CAPILLARY: GLUCOSE-CAPILLARY: 131 mg/dL — AB (ref 65–99)

## 2015-09-28 LAB — PROTIME-INR
INR: 1.54 — ABNORMAL HIGH (ref 0.00–1.49)
Prothrombin Time: 18.6 seconds — ABNORMAL HIGH (ref 11.6–15.2)

## 2015-09-28 LAB — AMMONIA: AMMONIA: 32 umol/L (ref 9–35)

## 2015-09-28 MED ORDER — GLUCERNA SHAKE PO LIQD: 237.0000 mL | Freq: Three times a day (TID) | ORAL | Status: AC

## 2015-09-28 MED ORDER — FOLIC ACID 1 MG PO TABS: 1.0000 mg | ORAL_TABLET | Freq: Every day | ORAL | Status: AC

## 2015-09-28 MED ORDER — FUROSEMIDE 20 MG PO TABS: 40.0000 mg | ORAL_TABLET | Freq: Every day | ORAL | Status: AC

## 2015-09-28 MED ORDER — PREDNISOLONE 5 MG PO TABS: 40.0000 mg | ORAL_TABLET | Freq: Every day | ORAL | Status: AC

## 2015-09-28 MED ORDER — LACTULOSE 10 GM/15ML PO SOLN: 10.0000 g | Freq: Two times a day (BID) | ORAL | Status: AC

## 2015-09-28 MED ORDER — ADULT MULTIVITAMIN W/MINERALS CH: 1.0000 | ORAL_TABLET | Freq: Every day | ORAL | Status: AC

## 2015-09-28 MED ORDER — MAGNESIUM CHLORIDE 64 MG PO TBEC: 1.0000 | DELAYED_RELEASE_TABLET | Freq: Two times a day (BID) | ORAL | Status: AC

## 2015-09-28 MED ORDER — PROPRANOLOL HCL 10 MG PO TABS: 10.0000 mg | ORAL_TABLET | Freq: Three times a day (TID) | ORAL | Status: AC

## 2015-09-28 MED ORDER — GLIPIZIDE 5 MG PO TABS
2.5000 mg | ORAL_TABLET | Freq: Every day | ORAL | Status: AC
Start: 2015-09-28 — End: ?

## 2015-09-28 MED ORDER — THIAMINE HCL 100 MG PO TABS: 100.0000 mg | ORAL_TABLET | Freq: Every day | ORAL | Status: AC

## 2015-09-28 MED ORDER — PANTOPRAZOLE SODIUM 40 MG PO TBEC: 40.0000 mg | DELAYED_RELEASE_TABLET | Freq: Every day | ORAL | Status: AC

## 2015-09-28 NOTE — Discharge Summary (Signed)
Chad Orozco, is a 35 y.o. male  DOB 06-09-1981  MRN [id number].  Admission date:  09/20/2015  Admitting Physician  Renae Fickle, MD  Discharge Date:  09/28/2015   Primary MD  patient will be following with alcohol rehabilitation center in [facility]  Recommendations for primary care physician for things to follow:  - Please check CBC, CMP in 3 days, will need close monitoring of LFTs. - Patient to continue prednisolone taper as an outpatient, will keep on PPI. - Patient discharged on Glucotrol for diabetes mellitus, please adjust medication as needed - Patient will need hepatitis B vaccine as an outpatient once liver function improved and off steroids.  Admission Diagnosis  Hypokalemia [E87.6] Hyperglycemia [R73.9] Alcoholic hepatitis with ascites [K70.11] Alcohol withdrawal, uncomplicated (HCC) [F10.230]   Discharge Diagnosis  Hypokalemia [E87.6] Hyperglycemia [R73.9] Alcoholic hepatitis with ascites [K70.11] Alcohol withdrawal, uncomplicated (HCC) [F10.230]    Principal Problem:   Alcoholic hepatitis Active Problems:   Hepatitis   Alcohol withdrawal (HCC)   Hyponatremia   Hypomagnesemia   Hypokalemia   Alcoholic hepatitis with ascites   Hematemesis without nausea   Diabetes mellitus type II, uncontrolled (HCC)   Hyperglycemia   Pleuritic chest pain   Abdominal pain      Past Medical History  Diagnosis Date  . Hypertension   . Alcohol abuse onset ~ age 52  . GERD (gastroesophageal reflux disease)   . Diabetes mellitus type II, uncontrolled (HCC) 09/22/2015  . Depression with anxiety 09/2015  . Hematemesis 09/2015    EGD 09/23/15: normal except for moderate portal hypertensive gastropathy.   . Alcoholic hepatitis 09/2015    ultrasound 09/2015: coarse liver texture with increased echogenicity: fatty liver and/or chronic hepatitis. Hepatitis B and C negative.   . Thrombocytopenia (HCC)  09/2015    platelet nadir 87  . Coagulopathy (HCC) 09/2015    in a/w acute alcoholic hepatitis.     Past Surgical History  Procedure Laterality Date  . Wisdom tooth extraction    . Esophagogastroduodenoscopy (egd) with propofol N/A 09/23/2015    Procedure: ESOPHAGOGASTRODUODENOSCOPY (EGD) WITH PROPOFOL;  Surgeon: Rachael Fee, MD;  Location: [facility];  Service: Endoscopy;  Laterality: N/A;       History of present illness and  Hospital Course:     Kindly see H&P for history of present illness and admission details, please review complete Labs, Consult reports and Test reports for all details in brief  HPI  from the history and physical done on the day of admission 09/20/2015 The patient is a 35 y.o. year-old male with history of elevated blood pressures, GERD, tobacco use, and alcohol abuse who presents with increasing fatigue, abdominal distension, yellowing of eyes. He reports that he normally drinks 5-6 tallboys per day and has for several years. He has multiple loose brown stools per day and has chronic nausea with intermittent vomiting which he reported to me as nonbloody and not like coffee grounds. Per the GI note, however, he reported to them that for the last two days  he has had bloody emesis. He developed strep throat in January and since then he has not felt well. He has had increased fatigue, worsening abdominal distension. Over the last week, he has increased his alcohol consumption to include a fifth of whiskey and some occasional wine on top of what he normally drinks. He has not taken any tylenol recently. He denies history of IVDA or sharing crack pipes although he does occasionally snort cocaine. His skin has turned bronzed, his urine has turned dark, and his eyes have turned yellow. He denies abdominal pain. He quit drinking alcohol several years ago and did not have symptoms of withdrawal per his report. Today, however, he is worried about alcohol withdrawal  because although his last drink was at 6am today, he has already had some "panic attacks" and received a total of  of valium since this morning. He has been a little dyspneic for the last few weeks without wheezing, frequent cough or fevers. He denies chest pains. Has sensation that food gets stuck in his throat, but denies hematemesis to me and denies dark, tarry stools, or blood in his stools.   In the ER, VS notable for tachycardia to the 120s and mildly elevated blood pressures tos the 140s-160s. WBC 15, platelets 121. Sodium 132, potassium 2.7, glucose 230. AST 285 and ALT 62, total bilirubin 11.4, INR 1.71. Alcohol level at 11AM was 354. UA concerning for >1000 glucose and large bilirubin. Limited abdominal US demonstrated no gallstones. He had layering gallbladder sludge with mild thickening of the gallbladder wall and mild ascites. CBd was normal. He had suggestion of fatty liver or chronic hepatitis. No color flow was identified in the portal vein.    Hospital Course  The patient is a 35 y.o. year-old male with history of elevated blood pressures, GERD, tobacco use, and alcohol abuse who presented with alcoholic hepatitis, hematemesis, and alcohol withdrawal. In the ER, VS notable for tachycardia to the 120s and mildly elevated blood pressures tos the 140s-160s. WBC 15, platelets 121. Sodium 132, potassium 2.7, glucose 230. AST 285 and ALT 62, total bilirubin 11.4, INR 1.71. Alcohol level at 11AM was 354. UA concerning for >1000 glucose and large bilirubin. Limited abdominal US demonstrated no gallstones. He had layering gallbladder sludge with mild thickening of the gallbladder wall and mild ascites. CBd was normal. He had suggestion of fatty liver or chronic hepatitis. No color flow was identified in the portal vein.   3/13: EGD demonstrates moderate portal hypertensive gastropathy  Likely alcoholic hepatitis - Steroids started on 3/13 , to finish total of 4  weeks at 40 mg by mouth prednisolone, Darl Pikes rapid taper after that. - Avoid hepatotoxins such as tylenol and statins - Hepatitis discriminant function is currently 55.  - Hepatitis A, B, and C negative >> will need vaccination as an outpatient when clinically improved, held on vaccination given he is on steroids, and with acute liver disease as discussed with GI. - HIV NONREACTIVE - GI consult greatly appreciated, his LFTs are staying very elevated,but it might take a few weeks to months before they normalize, patient is not encephalopathic, good appetite, he will be discharged today, with close follow-up of liver function as an outpatient, patient states he is going to alcohol rehabilitation center with a physician following at facility.  Increasing abdominal distension and pain, - likely from hepatitis. - No visible ascites fluid on ultrasound for paracentesis attempted on 3/14 - Negative urinalysis  Persistent cough with pleuritic chest pain, - I  suspect this is from his ascites/abdominal distension - CXR: No acute disease  Possible mild hepatic encephalopathy,  - Ammonia level trending down, most recent is 45, continue with lactulose.  Lack of color flow in portal vein  - could suggest portal vein thrombosis, however, he is pain free   Hematemesis - secondary to moderate portal hypertensive gastropathy but no varices detected on EGD.  - Tolerating diet, no evidence of bleed  Hypertension,  - likely hypertension, blood pressure stable - continue BB  Alcohol abuse/ withdrawal, - no significant symptoms of withdrawal - Continue with thiamine, folic acid and multivitamins   Gingivitis and possible thrush with bleeding tooth and gums - Resolved  Hyponatremia, hypokalemia, and hypophosphatemia  - due to alcohol abuse, resolved  Diabetes mellitus type 2,  - A1c 9.4 (this test may be effected by elevated bilirubin), CBG well controlled. New diagnosis  this admission.  - Diabetic educator and nutrition consultation  - CBGs has been controlled during hospital stay on insulin sliding scale, as well very likely steroids contributing, and started on low-dose Glucotrol 2.5 mg on discharge.  Palpitations  - for last few weeks, at risk for alcoholic cardiomyopathy - ECHO: Mild MR and TR, otherwise wnl. Preserved EF - Telemetry: NSR - TSH 1.211  Hyponatremia  - due to cirrhosis and hepatitis, resolved with IVF  Thrombocytopenia  - due to cirrhosis and acute illness, resolved   Leukocytosis,  - likely secondary to hepatitis and steroids. - Afebrile  Discharge Condition:  Stable   Follow UP - Patient will need close follow-up, his brother will fly him back to [place] today where he will be admitted to alcohol rehabilitation center, with a physician in a facility as per patient.     Discharge Instructions  and  Discharge Medications        Discharge Instructions    Discharge instructions    Complete by:  As directed   Follow with Primary MD in 3 days   Get CBC, CMP,  by Primary MD next visit.    Activity: As tolerated with Full fall precautions use walker/cane & assistance as needed   Disposition Home   Diet: Heart Healthy  , with feeding assistance and aspiration precautions.  For Heart failure patients - Check your Weight same time everyday, if you gain over 2 pounds, or you develop in leg swelling, experience more shortness of breath or chest pain, call your Primary MD immediately. Follow Cardiac Low Salt Diet and 1.5 lit/day fluid restriction.   On your next visit with your primary care physician please Get Medicines reviewed and adjusted.   Please request your Prim.MD to go over all Hospital Tests and Procedure/Radiological results at the follow up, please get all Hospital records sent to your Prim MD by signing hospital release before you go home.   If you experience worsening of your admission  symptoms, develop shortness of breath, life threatening emergency, suicidal or homicidal thoughts you must seek medical attention immediately by calling 911 or calling your MD immediately  if symptoms less severe.  You Must read complete instructions/literature along with all the possible adverse reactions/side effects for all the Medicines you take and that have been prescribed to you. Take any new Medicines after you have completely understood and accpet all the possible adverse reactions/side effects.   Do not drive, operating heavy machinery, perform activities at heights, swimming or participation in water activities or provide baby sitting services if your were admitted for syncope or siezures until  you have seen by Primary MD or a Neurologist and advised to do so again.  Do not drive when taking Pain medications.    Do not take more than prescribed Pain, Sleep and Anxiety Medications  Special Instructions: If you have smoked or chewed Tobacco  in the last 2 yrs please stop smoking, stop any regular Alcohol  and or any Recreational drug use.  Wear Seat belts while driving.   Please note  You were cared for by a hospitalist during your hospital stay. If you have any questions about your discharge medications or the care you received while you were in the hospital after you are discharged, you can call the unit and asked to speak with the hospitalist on call if the hospitalist that took care of you is not available. Once you are discharged, your primary care physician will handle any further medical issues. Please note that NO REFILLS for any discharge medications will be authorized once you are discharged, as it is imperative that you return to your primary care physician (or establish a relationship with a primary care physician if you do not have one) for your aftercare needs so that they can reassess your need for medications and monitor your lab values.     Increase activity slowly     Complete by:  As directed             Medication List    TAKE these medications        CENTRUM MEN Tabs  Take 1 tablet by mouth daily.     feeding supplement (GLUCERNA SHAKE) Liqd  Take 237 mLs by mouth 3 (three) times daily between meals.     folic acid 1 MG tablet  Commonly known as:  FOLVITE  Take 1 tablet (1 mg total) by mouth daily.     furosemide 20 MG tablet  Commonly known as:  LASIX  Take 2 tablets (40 mg total) by mouth daily.     glipiZIDE 5 MG tablet  Commonly known as:  GLUCOTROL  Take 0.5 tablets (2.5 mg total) by mouth daily before breakfast.     lactulose 10 GM/15ML solution  Commonly known as:  CHRONULAC  Take 15 mLs (10 g total) by mouth 2 (two) times daily.     magnesium chloride 64 MG Tbec SR tablet  Commonly known as:  SLOW-MAG  Take 1 tablet (64 mg total) by mouth 2 (two) times daily.     multivitamin with minerals Tabs tablet  Take 1 tablet by mouth daily.     pantoprazole 40 MG tablet  Commonly known as:  PROTONIX  Take 1 tablet (40 mg total) by mouth daily at 6 (six) AM.     prednisoLONE 5 MG Tabs tablet  Take 8 tablets (40 mg total) by mouth daily. Please take 40 mg oral daily for 21 days, then 30 mg oral daily for 3 days, then 20 mg oral daily for 3 days, then 10 mg oral daily for 3 days, then 5 mg oral daily for 3 days, then stop.     propranolol 10 MG tablet  Commonly known as:  INDERAL  Take 1 tablet (10 mg total) by mouth 3 (three) times daily.     thiamine 100 MG tablet  Take 1 tablet (100 mg total) by mouth daily.          Diet and Activity recommendation: See Discharge Instructions above   Consults obtained -  Gastroenterology   Major procedures and Radiology  Reports - PLEASE review detailed and final reports for all details, in brief -  endoscopy   Dg Chest 1 View  09/24/2015  CLINICAL DATA:  Chronic shortness of breath, patient smokes EXAM: CHEST 1 VIEW COMPARISON:  09/12/2015 FINDINGS: The heart size and  mediastinal contours are within normal limits. Both lungs are clear. The visualized skeletal structures are unremarkable. IMPRESSION: No active disease. Electronically Signed   By: Esperanza Heir M.D.   On: 09/24/2015 12:29   Dg Chest 2 View  09/12/2015  CLINICAL DATA:  35 year old male with left chest pain for 8 days, shortness of breath and cough. EXAM: CHEST  2 VIEW COMPARISON:  None. FINDINGS: The cardiomediastinal silhouette is unremarkable. There is no evidence of focal airspace disease, pulmonary edema, suspicious pulmonary nodule/mass, pleural effusion, or pneumothorax. No acute bony abnormalities are identified. IMPRESSION: No active cardiopulmonary disease. Electronically Signed   By: Harmon Pier M.D.   On: 09/12/2015 18:56   US Abdomen Limited  09/24/2015  CLINICAL DATA:  Abdomen pain, ascites. EXAM: LIMITED ABDOMEN ULTRASOUND FOR ASCITES TECHNIQUE: Limited ultrasound survey for ascites was performed in all four abdominal quadrants. COMPARISON:  September 20, 2015 FINDINGS: No ascites is identified in the abdomen. IMPRESSION: No ascites noted. Electronically Signed   By: Sherian Rein M.D.   On: 09/24/2015 15:57   US Abdomen Limited  09/20/2015  CLINICAL DATA:  Abnormal LFTs, ETOH abuse EXAM: US ABDOMEN LIMITED - RIGHT UPPER QUADRANT COMPARISON:  None. FINDINGS: Gallbladder: No shadowing gallstones are noted within gallbladder. Layering gallbladder sludge. Mild thickening of gallbladder wall up to 3.7 mm. No sonographic Murphy's sign. Common bile duct: Diameter: 4 mm in diameter within normal limits. Liver: There is coarse increased echogenicity of the liver suspicious for fatty infiltration or chronic hepatitis. Small perihepatic ascites. No focal hepatic mass. No color flow is identified within portal vein. IMPRESSION: 1. No shadowing gallstones are noted within gallbladder. Layering gallbladder sludge is noted. Mild thickening of gallbladder wall up to 3.7 mm without sonographic Murphy's sign.  Normal CBD. 2. There is coarse increased echogenicity of the liver suspicious for fatty infiltration or chronic hepatitis. Small perihepatic ascites. No focal hepatic mass. No color flow is identified within portal vein. Electronically Signed   By: Natasha Mead M.D.   On: 09/20/2015 13:43    Micro Results     Recent Results (from the past 240 hour(s))  MRSA PCR Screening     Status: None   Collection Time: 09/20/15  6:22 PM  Result Value Ref Range Status   MRSA by PCR NEGATIVE NEGATIVE Final    Comment:        The GeneXpert MRSA Assay (FDA approved for NASAL specimens only), is one component of a comprehensive MRSA colonization surveillance program. It is not intended to diagnose MRSA infection nor to guide or monitor treatment for MRSA infections.        Today   Subjective:   Chad Orozco today has no headache,no chest abdominal pain,no new weakness tingling or numbness, feels much better wants to go home today.  Objective:   Blood pressure 111/65, pulse 68, temperature 97.2 F (36.2 C), temperature source Oral, resp. rate 16, height 5\' 7"  (1.702 m), weight 93.5 kg (206 lb 2.1 oz), SpO2 97 %.   Intake/Output Summary (Last 24 hours) at 09/28/15 1039 Last data filed at 09/28/15 0700  Gross per 24 hour  Intake    240 ml  Output      0 ml  Net  240 ml    Exam  General: Increasingly jaundiced male, No acute distress  HEENT: NCAT, mouth clear of blood today  Cardiovascular: RRR, nl S1, S2 no mrg, 2+ pulses, warm extremities  Respiratory: Diminished at the bilateral bases, no wheezes, rales, or rhonchi, no increased WOB  Abdomen: NABS, soft, very distended,But no tenderness.  MSK: Normal tone and bulk, trace bilateral ankle edema  Neuro: Grossly moves all extremities  Data Review   CBC w Diff:  Lab Results  Component Value Date   WBC 18.1* 09/26/2015   HGB 13.6 09/26/2015   HCT 39.7 09/26/2015   PLT 245 09/26/2015   LYMPHOPCT 6 09/20/2015     MONOPCT 10 09/20/2015   EOSPCT 0 09/20/2015   BASOPCT 0 09/20/2015    CMP:  Lab Results  Component Value Date   NA 140 09/28/2015   K 3.5 09/28/2015   CL 100* 09/28/2015   CO2 27 09/28/2015   BUN 10 09/28/2015   CREATININE 0.46* 09/28/2015   PROT 5.7* 09/28/2015   ALBUMIN 2.2* 09/28/2015   BILITOT 20.6* 09/28/2015   ALKPHOS 178* 09/28/2015   AST 202* 09/28/2015   ALT 115* 09/28/2015  .   Total Time in preparing paper work, data evaluation and todays exam - 35 minutes  Arline Ketter M.D on 09/28/2015 at 10:39 AM  [facility]   Office  [phone]

## 2015-09-28 NOTE — Progress Notes (Signed)
Pt with total bilirubin level of 20.6. This result is consistent with previous levels and doesn't require call to MD at this time.

## 2015-09-28 NOTE — Discharge Instructions (Signed)
Follow with Primary MD in 7 days   Get CBC, CMP,  by Primary MD next visit.    Activity: As tolerated with Full fall precautions use walker/cane & assistance as needed   Disposition Home   Diet: Heart Healthy  , with feeding assistance and aspiration precautions.  For Heart failure patients - Check your Weight same time everyday, if you gain over 2 pounds, or you develop in leg swelling, experience more shortness of breath or chest pain, call your Primary MD immediately. Follow Cardiac Low Salt Diet and 1.5 lit/day fluid restriction.   On your next visit with your primary care physician please Get Medicines reviewed and adjusted.   Please request your Prim.MD to go over all Hospital Tests and Procedure/Radiological results at the follow up, please get all Hospital records sent to your Prim MD by signing hospital release before you go home.   If you experience worsening of your admission symptoms, develop shortness of breath, life threatening emergency, suicidal or homicidal thoughts you must seek medical attention immediately by calling 911 or calling your MD immediately  if symptoms less severe.  You Must read complete instructions/literature along with all the possible adverse reactions/side effects for all the Medicines you take and that have been prescribed to you. Take any new Medicines after you have completely understood and accpet all the possible adverse reactions/side effects.   Do not drive, operating heavy machinery, perform activities at heights, swimming or participation in water activities or provide baby sitting services if your were admitted for syncope or siezures until you have seen by Primary MD or a Neurologist and advised to do so again.  Do not drive when taking Pain medications.    Do not take more than prescribed Pain, Sleep and Anxiety Medications  Special Instructions: If you have smoked or chewed Tobacco  in the last 2 yrs please stop smoking, stop any  regular Alcohol  and or any Recreational drug use.  Wear Seat belts while driving.   Please note  You were cared for by a hospitalist during your hospital stay. If you have any questions about your discharge medications or the care you received while you were in the hospital after you are discharged, you can call the unit and asked to speak with the hospitalist on call if the hospitalist that took care of you is not available. Once you are discharged, your primary care physician will handle any further medical issues. Please note that NO REFILLS for any discharge medications will be authorized once you are discharged, as it is imperative that you return to your primary care physician (or establish a relationship with a primary care physician if you do not have one) for your aftercare needs so that they can reassess your need for medications and monitor your lab values.

## 2016-04-17 IMAGING — DX DG CHEST 1V
1 series · 1 of 1 positions shown · non-contrast
Comparison: 09/12/2015

CLINICAL DATA: Chronic shortness of breath, patient smokes

EXAM:
CHEST 1 VIEW

[chest ap]
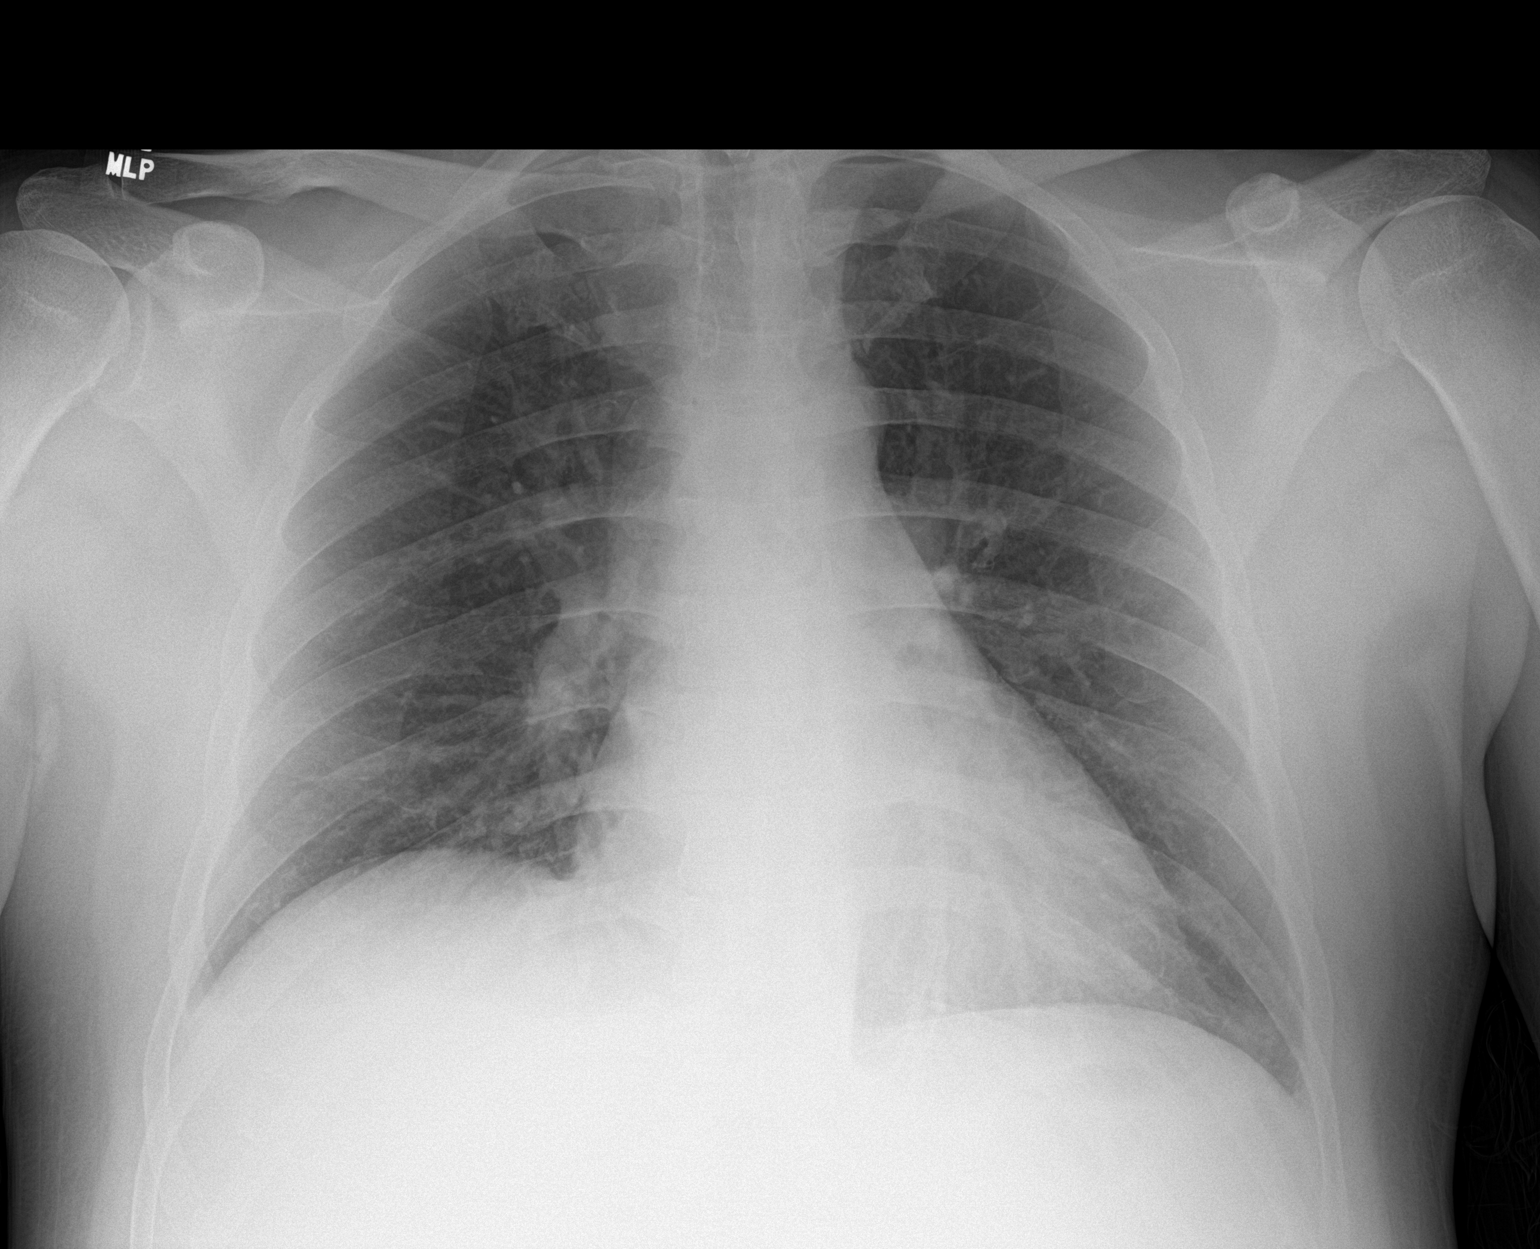

[1 of 1 positions shown; findings below may reference images not displayed]

FINDINGS: The heart size and mediastinal contours are within normal limits.
Both lungs are clear. The visualized skeletal structures are
unremarkable.
IMPRESSION: No active disease.
